# Patient Record
Sex: Female | Born: 1975 | Race: Black or African American | Hispanic: No | Marital: Married | Smoking: Never smoker
Health system: Southern US, Community
[De-identification: ages and names within clinical notes are randomized; demographics above are authoritative.]

## PROBLEM LIST (undated history)

## (undated) ENCOUNTER — Inpatient Hospital Stay (HOSPITAL_COMMUNITY): Payer: Self-pay

## (undated) DIAGNOSIS — D649 Anemia, unspecified: Secondary | ICD-10-CM

## (undated) DIAGNOSIS — O139 Gestational [pregnancy-induced] hypertension without significant proteinuria, unspecified trimester: Secondary | ICD-10-CM

## (undated) DIAGNOSIS — B999 Unspecified infectious disease: Secondary | ICD-10-CM

## (undated) HISTORY — DX: Unspecified infectious disease: B99.9

## (undated) HISTORY — DX: Gestational (pregnancy-induced) hypertension without significant proteinuria, unspecified trimester: O13.9

## (undated) HISTORY — DX: Anemia, unspecified: D64.9

---

## 1999-08-04 DIAGNOSIS — O139 Gestational [pregnancy-induced] hypertension without significant proteinuria, unspecified trimester: Secondary | ICD-10-CM

## 1999-08-04 HISTORY — DX: Gestational (pregnancy-induced) hypertension without significant proteinuria, unspecified trimester: O13.9

## 2006-08-03 HISTORY — PX: WISDOM TOOTH EXTRACTION: SHX21

## 2012-05-12 ENCOUNTER — Encounter: Payer: Self-pay | Admitting: Obstetrics and Gynecology

## 2012-05-12 ENCOUNTER — Ambulatory Visit (INDEPENDENT_AMBULATORY_CARE_PROVIDER_SITE_OTHER): Payer: 59 | Admitting: Obstetrics and Gynecology

## 2012-05-12 DIAGNOSIS — D649 Anemia, unspecified: Secondary | ICD-10-CM | POA: Insufficient documentation

## 2012-05-12 DIAGNOSIS — Z331 Pregnant state, incidental: Secondary | ICD-10-CM

## 2012-05-12 DIAGNOSIS — Z3201 Encounter for pregnancy test, result positive: Secondary | ICD-10-CM

## 2012-05-12 LAB — POCT URINALYSIS DIPSTICK
Blood, UA: NEGATIVE
Ketones, UA: NEGATIVE
Spec Grav, UA: <=1.005
Urobilinogen, UA: NEGATIVE
pH, UA: 8

## 2012-05-12 NOTE — Progress Notes (Signed)
NOB interview.  

## 2012-05-13 ENCOUNTER — Telehealth: Payer: Self-pay

## 2012-05-13 LAB — PRENATAL PANEL VII
Basophils Absolute: 0.1 10*3/uL (ref 0.0–0.1)
Basophils Relative: 1 % (ref 0–1)
HCT: 25.8 % — ABNORMAL LOW (ref 36.0–46.0)
HIV: NONREACTIVE
Hemoglobin: 7 g/dL — ABNORMAL LOW (ref 12.0–15.0)
Hepatitis B Surface Ag: NEGATIVE
Lymphocytes Relative: 32 % (ref 12–46)
MCHC: 27.1 g/dL — ABNORMAL LOW (ref 30.0–36.0)
Monocytes Absolute: 0.5 10*3/uL (ref 0.1–1.0)
Neutro Abs: 4.3 10*3/uL (ref 1.7–7.7)
Neutrophils Relative %: 59 % (ref 43–77)
RDW: 23.6 % — ABNORMAL HIGH (ref 11.5–15.5)
WBC: 7.1 10*3/uL (ref 4.0–10.5)

## 2012-05-13 NOTE — Telephone Encounter (Signed)
LM for pt that hgb is low and VL would like for her to add Fe twice daily to her PNV.  To call if questions.  ld

## 2012-05-13 NOTE — Telephone Encounter (Signed)
Message copied by Larwance Rote on Fri May 13, 2012  9:41 AM ------      Message from: Cornelius Moras      Created: Thu May 12, 2012  9:19 PM       Needs FeSO4 BID.

## 2012-05-15 ENCOUNTER — Encounter: Payer: Self-pay | Admitting: Obstetrics and Gynecology

## 2012-05-15 DIAGNOSIS — R8279 Other abnormal findings on microbiological examination of urine: Secondary | ICD-10-CM | POA: Insufficient documentation

## 2012-05-15 LAB — CULTURE, OB URINE: Colony Count: 10000

## 2012-05-16 ENCOUNTER — Encounter: Payer: Self-pay | Admitting: Obstetrics and Gynecology

## 2012-05-16 DIAGNOSIS — Z283 Underimmunization status: Secondary | ICD-10-CM | POA: Insufficient documentation

## 2012-05-16 DIAGNOSIS — O9989 Other specified diseases and conditions complicating pregnancy, childbirth and the puerperium: Secondary | ICD-10-CM

## 2012-05-16 LAB — HEMOGLOBINOPATHY EVALUATION
Hgb A2 Quant: 1.9 % — ABNORMAL LOW (ref 2.2–3.2)
Hgb A: 96.8 % (ref 96.8–97.8)
Hgb F Quant: 1.3 % (ref 0.0–2.0)
Hgb S Quant: 0 %

## 2012-06-01 ENCOUNTER — Ambulatory Visit (INDEPENDENT_AMBULATORY_CARE_PROVIDER_SITE_OTHER): Payer: 59 | Admitting: Obstetrics and Gynecology

## 2012-06-01 VITALS — BP 112/72 | Wt 156.0 lb

## 2012-06-01 DIAGNOSIS — R82998 Other abnormal findings in urine: Secondary | ICD-10-CM

## 2012-06-01 DIAGNOSIS — Z331 Pregnant state, incidental: Secondary | ICD-10-CM

## 2012-06-01 DIAGNOSIS — O26849 Uterine size-date discrepancy, unspecified trimester: Secondary | ICD-10-CM

## 2012-06-01 DIAGNOSIS — R8279 Other abnormal findings on microbiological examination of urine: Secondary | ICD-10-CM

## 2012-06-01 DIAGNOSIS — O09299 Supervision of pregnancy with other poor reproductive or obstetric history, unspecified trimester: Secondary | ICD-10-CM

## 2012-06-01 DIAGNOSIS — D649 Anemia, unspecified: Secondary | ICD-10-CM

## 2012-06-01 LAB — POCT URINALYSIS DIPSTICK
Nitrite, UA: NEGATIVE
Spec Grav, UA: 1.01
Urobilinogen, UA: NEGATIVE

## 2012-06-01 LAB — IRON AND TIBC: Iron: 30 ug/dL — ABNORMAL LOW (ref 42–145)

## 2012-06-01 NOTE — Progress Notes (Signed)
  Subjective:    Patricia Garrett is being seen today for her first obstetrical visit at [redacted]w[redacted]d gestation by LMP.  She reports doing well.  She and partner are vegetarian, but do eat fish.  Safe intake of fish reviewed.  Does take vitamins.  Had pre-eclampsia with last pregnancy, "but was eating very poorly and not taking care of myself".  This pregnancy is with new partner.  Delivered at [facility] [place].  Had elevated BP in 3rd trimester, then 24 hour urine showed pre-eclampsia, and had oligo.  Was induced, but doesn't remember being on magnesium.  No meds for BP required PP.  Her obstetrical history is significant for: Patient Active Problem List  Diagnosis  . Anemia  . Positive urine culture  . Rubella non-immune status, antepartum  . Hx of pre-eclampsia in prior pregnancy, currently pregnant  . Hx of oligohydramnios in prior pregnancy, currently pregnant  Urine culture + for 10,000 col staph--plan repeat culture.  Relationship with FOB:  Supportive, not married, new partner--Robert.  Feeding plan:   Breast  Pregnancy history fully reviewed.  The following portions of the patient's history were reviewed and updated as appropriate: allergies, current medications, past family history, past medical history, past social history, past surgical history and problem list.  Review of Systems Pertinent ROS is described in HPI   Objective:   BP 112/72  Wt 156 lb (70.761 kg)  LMP 03/21/2012 Wt Readings from Last 1 Encounters:  06/01/12 156 lb (70.761 kg)   BMI: There is no height on file to calculate BMI.  General: alert, cooperative and no distress Respiratory: clear to auscultation bilaterally Cardiovascular: regular rate and rhythm, S1, S2 normal, no murmur Breasts:  No dominant masses, nipples erect Gastrointestinal: soft, non-tender; no masses,  no organomegaly Extremities: extremities normal, no pain or edema Vaginal Bleeding: None  EXTERNAL GENITALIA: normal appearing vulva with  no masses, tenderness or lesions VAGINA: no abnormal discharge or lesions CERVIX: no lesions or cervical motion tenderness; cervix closed, long, firm UTERUS: gravid and consistent with 12-14 weeks, with patient 10 2/7 weeks by certain LMP. ADNEXA: no masses palpable and nontender OB EXAM PELVIMETRY: appears adequate   FHR:  160  bpm  Bedside US--apparent single gestation, +FM.   No attempt to date pregnancy or make any further evaluations with this machine. Offered [place] today, but patient unable to wait.  Assessment:    Pregnancy at  10 2/7 weeks by LMP, but S>D. Anemia Rubella equivocal Hx pre-eclampsia and oligohydramnios in previous pregnancy. Plan:     Prenatal panel reviewed and discussed with the patient:  yes Pap smear collected:  yes GC/Chlamydia collected:  yes Wet prep:  Negative Discussion of Genetic testing options: Declines Prenatal vitamins recommended Problem list reviewed and updated.  Plan of care: Next visit:  Within 1 week for [place] for dating due to S>D. Labs today in w/u for significant anemia--TIBC, peripheral blood smear, serum ferritin, serum iron.  Hgb electrophoresis already done and WNL. Recommend Floridix BID. Reviewed equivocal rubella and offer to re-vaccinate pp. Reviewed hx of pre-eclampsia and oligo in previous pregnancy--will observe closely.   Nigel Bridgeman, CNM, [place]

## 2012-06-01 NOTE — Progress Notes (Signed)
Pt is here today for her NOB work -up last pap over a year and half per pt.

## 2012-06-02 ENCOUNTER — Encounter: Payer: Self-pay | Admitting: Obstetrics and Gynecology

## 2012-06-02 LAB — PATHOLOGIST SMEAR REVIEW

## 2012-06-03 LAB — PAP IG, CT-NG, RFX HPV ASCU
Chlamydia Probe Amp: NEGATIVE
GC Probe Amp: NEGATIVE

## 2012-06-06 ENCOUNTER — Encounter: Payer: Self-pay | Admitting: Obstetrics and Gynecology

## 2012-06-08 ENCOUNTER — Other Ambulatory Visit: Payer: Self-pay | Admitting: Obstetrics and Gynecology

## 2012-06-08 ENCOUNTER — Ambulatory Visit (INDEPENDENT_AMBULATORY_CARE_PROVIDER_SITE_OTHER): Payer: 59 | Admitting: Obstetrics and Gynecology

## 2012-06-08 ENCOUNTER — Ambulatory Visit (INDEPENDENT_AMBULATORY_CARE_PROVIDER_SITE_OTHER): Payer: 59

## 2012-06-08 ENCOUNTER — Encounter: Payer: Self-pay | Admitting: Obstetrics and Gynecology

## 2012-06-08 VITALS — BP 120/70 | Ht 68.0 in | Wt 154.0 lb

## 2012-06-08 DIAGNOSIS — Z331 Pregnant state, incidental: Secondary | ICD-10-CM

## 2012-06-08 DIAGNOSIS — O26849 Uterine size-date discrepancy, unspecified trimester: Secondary | ICD-10-CM

## 2012-06-08 DIAGNOSIS — Z3490 Encounter for supervision of normal pregnancy, unspecified, unspecified trimester: Secondary | ICD-10-CM

## 2012-06-08 DIAGNOSIS — D649 Anemia, unspecified: Secondary | ICD-10-CM

## 2012-06-08 LAB — US OB COMP LESS 14 WKS

## 2012-06-08 NOTE — Progress Notes (Signed)
[redacted]w[redacted]d Ultrasound: Single gestation, normal fetal heart rate, ovaries normal, 12 weeks and 2 days. The patient reports that she is not certain about her last menstrual period.  Her due date will now be Dec 19, 2012 based on today's ultrasound. Anemia workup shows chronic anemia.  I recommend that the patient take iron 3 times daily.  Vitamin C recommended.  Fiber recommended for constipation. Genetic screening discussed.  Declines for now. Return to office in 4 weeks. Dr. Stefano Gaul

## 2012-06-08 NOTE — Progress Notes (Signed)
[redacted]w[redacted]d  U/S: S>D, Dating   12w 2 D IUP, Simple Right Ovarian cyst, Normal Left Ovary, No fluid in CDS, Normal [place].

## 2012-06-10 LAB — CULTURE, OB URINE

## 2012-07-06 ENCOUNTER — Ambulatory Visit (INDEPENDENT_AMBULATORY_CARE_PROVIDER_SITE_OTHER): Payer: 59 | Admitting: Obstetrics and Gynecology

## 2012-07-06 VITALS — BP 110/60 | Wt 156.0 lb

## 2012-07-06 DIAGNOSIS — R8279 Other abnormal findings on microbiological examination of urine: Secondary | ICD-10-CM

## 2012-07-06 DIAGNOSIS — R82998 Other abnormal findings in urine: Secondary | ICD-10-CM

## 2012-07-06 DIAGNOSIS — D649 Anemia, unspecified: Secondary | ICD-10-CM

## 2012-07-06 NOTE — Progress Notes (Signed)
Pt stated no issues today. Pt declined flu shot today.  

## 2012-07-29 ENCOUNTER — Other Ambulatory Visit: Payer: Self-pay

## 2012-07-29 DIAGNOSIS — Z3689 Encounter for other specified antenatal screening: Secondary | ICD-10-CM

## 2012-08-01 ENCOUNTER — Ambulatory Visit (INDEPENDENT_AMBULATORY_CARE_PROVIDER_SITE_OTHER): Payer: 59

## 2012-08-01 ENCOUNTER — Other Ambulatory Visit: Payer: 59

## 2012-08-01 ENCOUNTER — Ambulatory Visit (INDEPENDENT_AMBULATORY_CARE_PROVIDER_SITE_OTHER): Payer: 59 | Admitting: Obstetrics and Gynecology

## 2012-08-01 ENCOUNTER — Encounter: Payer: Self-pay | Admitting: Obstetrics and Gynecology

## 2012-08-01 VITALS — BP 110/70 | Wt 166.0 lb

## 2012-08-01 DIAGNOSIS — Z3689 Encounter for other specified antenatal screening: Secondary | ICD-10-CM

## 2012-08-01 DIAGNOSIS — Z331 Pregnant state, incidental: Secondary | ICD-10-CM

## 2012-08-01 DIAGNOSIS — Z3490 Encounter for supervision of normal pregnancy, unspecified, unspecified trimester: Secondary | ICD-10-CM

## 2012-08-01 LAB — US OB COMP + 14 WK

## 2012-08-01 NOTE — Progress Notes (Signed)
[redacted]w[redacted]d Ultrasound shows:  SIUP  S=D     [place] EDD: 12/09/12           AFI: normal fluid. AP pocket 6.7 cm           Cervical length: 3.85 cm           Placenta localization: posterior / Placenta edge to cx 6.5 cm           Fetal presentation: breech                    Anatomy survey is normal           Gender : female

## 2012-08-01 NOTE — Progress Notes (Signed)
Doing well.  Declines genetic screening. [place] reviewed, all WNL Will check Hgb at NV--7 at NOB. Reviewed rubella status and plan for offering vaccine pp. Recommend Floridix for Fe supplementation.

## 2012-08-03 NOTE — L&D Delivery Note (Signed)
Delivery Note  Pt was preparing to get epidural and had urge to push, cervix was complete and vtx +1, pt continued pushing well, FHR w decel to 80's, position changes and pt enc to continue pushing   At 12:28 AM a viable female was delivered via Vaginal, Spontaneous Delivery (Presentation: ; Occiput Posterior).  Delivered through loose nuchal cord, APGAR: 9, 9; weight: pending  Placenta status: Intact, Spontaneous.  Cord: 3 vessels with the following complications: None.  Cord pH: n/a   Anesthesia:  Local for repair  Episiotomy: None Lacerations: 1st degree;Perineal Suture Repair: 3.0 vicryl rapide Est. Blood Loss (mL): 300cc   Mom to postpartum.  Baby to nursery-stable. Infant remains skin-skin, mom and baby stable in recovery Pt plans to BF Declines circumcision  BP's remain stable, will CTO for now   Mileydi Milsap M 12/19/2012, 1:02 AM

## 2012-08-30 ENCOUNTER — Ambulatory Visit: Payer: 59 | Admitting: Obstetrics and Gynecology

## 2012-08-30 ENCOUNTER — Encounter: Payer: Self-pay | Admitting: Obstetrics and Gynecology

## 2012-08-30 VITALS — BP 110/76 | Wt 167.0 lb

## 2012-08-30 DIAGNOSIS — R8279 Other abnormal findings on microbiological examination of urine: Secondary | ICD-10-CM

## 2012-08-30 NOTE — Progress Notes (Signed)
[redacted]w[redacted]d No complaints per pt

## 2012-08-30 NOTE — Progress Notes (Signed)
Doing well--has increased nutritional intake of iron (blackstrap molasses, artichoke tea), prefers to Rx/OTC Fe preparations. Reviewed issue of anemia--will check CBC at [facility].  If still low, will refer to hematologist. Anticipating requirement of work travel to [place] in [place] precautions reviewed.  Will be around 30-32 weeks--would recommend no further significant travel after that.  Will see before trip to ensure status before traveling.

## 2012-09-27 ENCOUNTER — Ambulatory Visit: Payer: 59 | Admitting: Obstetrics and Gynecology

## 2012-09-27 ENCOUNTER — Other Ambulatory Visit: Payer: 59

## 2012-09-27 VITALS — BP 122/68 | Wt 171.0 lb

## 2012-09-27 DIAGNOSIS — D649 Anemia, unspecified: Secondary | ICD-10-CM

## 2012-09-27 DIAGNOSIS — Z331 Pregnant state, incidental: Secondary | ICD-10-CM

## 2012-09-27 DIAGNOSIS — Z3480 Encounter for supervision of other normal pregnancy, unspecified trimester: Secondary | ICD-10-CM | POA: Insufficient documentation

## 2012-09-27 DIAGNOSIS — Z3482 Encounter for supervision of other normal pregnancy, second trimester: Secondary | ICD-10-CM

## 2012-09-27 DIAGNOSIS — Z283 Underimmunization status: Secondary | ICD-10-CM

## 2012-09-27 DIAGNOSIS — R8279 Other abnormal findings on microbiological examination of urine: Secondary | ICD-10-CM

## 2012-09-27 LAB — CBC
Hemoglobin: 7.6 g/dL — ABNORMAL LOW (ref 12.0–15.0)
MCHC: 30.3 g/dL (ref 30.0–36.0)
Platelets: 255 10*3/uL (ref 150–400)
RDW: 21.3 % — ABNORMAL HIGH (ref 11.5–15.5)

## 2012-09-27 NOTE — Progress Notes (Signed)
[redacted]w[redacted]d Pt denies any complaints 1 hr glu and CBC

## 2012-09-28 LAB — GLUCOSE TOLERANCE, 1 HOUR (50G) W/O FASTING: Glucose, 1 Hour GTT: 89 mg/dL (ref 70–140)

## 2012-09-28 LAB — RPR

## 2012-09-30 ENCOUNTER — Other Ambulatory Visit: Payer: Self-pay

## 2012-09-30 DIAGNOSIS — D509 Iron deficiency anemia, unspecified: Secondary | ICD-10-CM

## 2012-10-03 ENCOUNTER — Telehealth: Payer: Self-pay | Admitting: Oncology

## 2012-10-03 NOTE — Telephone Encounter (Signed)
S/W pt in re NP appt 03/17 @ 1:30 w/Dr. Gaylyn Rong Referring Dr. Jamison Neighbor DX- IDA and preg Welcome packet mailed.

## 2012-10-04 ENCOUNTER — Telehealth: Payer: Self-pay | Admitting: Oncology

## 2012-10-04 NOTE — Telephone Encounter (Signed)
C/D 10/04/12 for appt. 10/17/12

## 2012-10-11 ENCOUNTER — Ambulatory Visit: Payer: 59 | Admitting: Obstetrics and Gynecology

## 2012-10-11 ENCOUNTER — Encounter: Payer: Self-pay | Admitting: Obstetrics and Gynecology

## 2012-10-11 VITALS — BP 112/70 | Wt 175.0 lb

## 2012-10-11 DIAGNOSIS — Z331 Pregnant state, incidental: Secondary | ICD-10-CM

## 2012-10-11 NOTE — Patient Instructions (Signed)
Fetal Movement Counts Patient Name: __________________________________________________ Patient Due Date: ____________________ Kick counts is highly recommended in high risk pregnancies, but it is a good idea for every pregnant woman to do. Start counting fetal movements at 28 weeks of the pregnancy. Fetal movements increase after eating a full meal or eating or drinking something sweet (the blood sugar is higher). It is also important to drink plenty of fluids (well hydrated) before doing the count. Lie on your left side because it helps with the circulation or you can sit in a comfortable chair with your arms over your belly (abdomen) with no distractions around you. DOING THE COUNT  Try to do the count the same time of day each time you do it.  Mark the day and time, then see how long it takes for you to feel 10 movements (kicks, flutters, swishes, rolls). You should have at least 10 movements within 2 hours. You will most likely feel 10 movements in much less than 2 hours. If you do not, wait an hour and count again. After a couple of days you will see a pattern.  What you are looking for is a change in the pattern or not enough counts in 2 hours. Is it taking longer in time to reach 10 movements? SEEK MEDICAL CARE IF:  You feel less than 10 counts in 2 hours. Tried twice.  No movement in one hour.  The pattern is changing or taking longer each day to reach 10 counts in 2 hours.  You feel the baby is not moving as it usually does. Date: ____________ Movements: ____________ Start time: ____________ Finish time: ____________  Date: ____________ Movements: ____________ Start time: ____________ Finish time: ____________ Date: ____________ Movements: ____________ Start time: ____________ Finish time: ____________ Date: ____________ Movements: ____________ Start time: ____________ Finish time: ____________ Date: ____________ Movements: ____________ Start time: ____________ Finish time:  ____________ Date: ____________ Movements: ____________ Start time: ____________ Finish time: ____________ Date: ____________ Movements: ____________ Start time: ____________ Finish time: ____________ Date: ____________ Movements: ____________ Start time: ____________ Finish time: ____________  Date: ____________ Movements: ____________ Start time: ____________ Finish time: ____________ Date: ____________ Movements: ____________ Start time: ____________ Finish time: ____________ Date: ____________ Movements: ____________ Start time: ____________ Finish time: ____________ Date: ____________ Movements: ____________ Start time: ____________ Finish time: ____________ Date: ____________ Movements: ____________ Start time: ____________ Finish time: ____________ Date: ____________ Movements: ____________ Start time: ____________ Finish time: ____________ Date: ____________ Movements: ____________ Start time: ____________ Finish time: ____________  Date: ____________ Movements: ____________ Start time: ____________ Finish time: ____________ Date: ____________ Movements: ____________ Start time: ____________ Finish time: ____________ Date: ____________ Movements: ____________ Start time: ____________ Finish time: ____________ Date: ____________ Movements: ____________ Start time: ____________ Finish time: ____________ Date: ____________ Movements: ____________ Start time: ____________ Finish time: ____________ Date: ____________ Movements: ____________ Start time: ____________ Finish time: ____________ Date: ____________ Movements: ____________ Start time: ____________ Finish time: ____________  Date: ____________ Movements: ____________ Start time: ____________ Finish time: ____________ Date: ____________ Movements: ____________ Start time: ____________ Finish time: ____________ Date: ____________ Movements: ____________ Start time: ____________ Finish time: ____________ Date: ____________ Movements:  ____________ Start time: ____________ Finish time: ____________ Date: ____________ Movements: ____________ Start time: ____________ Finish time: ____________ Date: ____________ Movements: ____________ Start time: ____________ Finish time: ____________ Date: ____________ Movements: ____________ Start time: ____________ Finish time: ____________  Date: ____________ Movements: ____________ Start time: ____________ Finish time: ____________ Date: ____________ Movements: ____________ Start time: ____________ Finish time: ____________ Date: ____________ Movements: ____________ Start time: ____________ Finish time: ____________ Date: ____________ Movements:   ____________ Start time: ____________ Finish time: ____________ Date: ____________ Movements: ____________ Start time: ____________ Finish time: ____________ Date: ____________ Movements: ____________ Start time: ____________ Finish time: ____________ Date: ____________ Movements: ____________ Start time: ____________ Finish time: ____________  Date: ____________ Movements: ____________ Start time: ____________ Finish time: ____________ Date: ____________ Movements: ____________ Start time: ____________ Finish time: ____________ Date: ____________ Movements: ____________ Start time: ____________ Finish time: ____________ Date: ____________ Movements: ____________ Start time: ____________ Finish time: ____________ Date: ____________ Movements: ____________ Start time: ____________ Finish time: ____________ Date: ____________ Movements: ____________ Start time: ____________ Finish time: ____________ Date: ____________ Movements: ____________ Start time: ____________ Finish time: ____________  Date: ____________ Movements: ____________ Start time: ____________ Finish time: ____________ Date: ____________ Movements: ____________ Start time: ____________ Finish time: ____________ Date: ____________ Movements: ____________ Start time: ____________ Finish  time: ____________ Date: ____________ Movements: ____________ Start time: ____________ Finish time: ____________ Date: ____________ Movements: ____________ Start time: ____________ Finish time: ____________ Date: ____________ Movements: ____________ Start time: ____________ Finish time: ____________ Date: ____________ Movements: ____________ Start time: ____________ Finish time: ____________  Date: ____________ Movements: ____________ Start time: ____________ Finish time: ____________ Date: ____________ Movements: ____________ Start time: ____________ Finish time: ____________ Date: ____________ Movements: ____________ Start time: ____________ Finish time: ____________ Date: ____________ Movements: ____________ Start time: ____________ Finish time: ____________ Date: ____________ Movements: ____________ Start time: ____________ Finish time: ____________ Date: ____________ Movements: ____________ Start time: ____________ Finish time: ____________ Document Released: 08/19/2006 Document Revised: 10/12/2011 Document Reviewed: 02/19/2009 [employer] Patient Information 2013 [employer].  

## 2012-10-11 NOTE — Progress Notes (Signed)
Pt decline flu shot at this time

## 2012-10-11 NOTE — Progress Notes (Signed)
[redacted]w[redacted]d Pt declined hematology eval bc she has not been taking iron.  Will  Take floridex.  Recheck at 36 weeks.  Pt understnads she may need blood transfusion after SVD Results for orders placed in visit on 09/27/12  RPR      Result Value Range   RPR NON REAC  NON REAC  GLUCOSE TOLERANCE, 1 HOUR (50G) W/O FASTING      Result Value Range   Glucose, 1 Hour GTT 89  70 - 140 mg/dL  CBC      Result Value Range   WBC 7.9  4.0 - 10.5 K/uL   RBC 4.05  3.87 - 5.11 MIL/uL   Hemoglobin 7.6 (*) 12.0 - 15.0 g/dL   HCT 45.4 (*) 09.8 - 11.9 %   MCV 62.0 (*) 78.0 - 100.0 fL   MCH 18.8 (*) 26.0 - 34.0 pg   MCHC 30.3  30.0 - 36.0 g/dL   RDW 14.7 (*) 82.9 - 56.2 %   Platelets 255  150 - 400 K/uL

## 2012-10-17 ENCOUNTER — Ambulatory Visit: Payer: Self-pay | Admitting: Oncology

## 2012-10-17 ENCOUNTER — Ambulatory Visit: Payer: Self-pay

## 2012-10-17 ENCOUNTER — Other Ambulatory Visit: Payer: Self-pay | Admitting: Lab

## 2012-10-20 NOTE — Progress Notes (Signed)
No show.  Reschedule prn.  

## 2012-11-25 LAB — OB RESULTS CONSOLE GC/CHLAMYDIA
Chlamydia: NEGATIVE
Gonorrhea: NEGATIVE

## 2012-11-25 LAB — OB RESULTS CONSOLE GBS: GBS: POSITIVE

## 2012-12-18 ENCOUNTER — Encounter (HOSPITAL_COMMUNITY): Payer: Self-pay | Admitting: *Deleted

## 2012-12-18 ENCOUNTER — Inpatient Hospital Stay (HOSPITAL_COMMUNITY)
Admission: AD | Admit: 2012-12-18 | Discharge: 2012-12-20 | DRG: 775 | Disposition: A | Discharge status: Home / Self Care | Payer: 59 | Source: Ambulatory Visit | Attending: Obstetrics and Gynecology | Admitting: Obstetrics and Gynecology

## 2012-12-18 DIAGNOSIS — O99820 Streptococcus B carrier state complicating pregnancy: Secondary | ICD-10-CM

## 2012-12-18 DIAGNOSIS — O99892 Other specified diseases and conditions complicating childbirth: Secondary | ICD-10-CM | POA: Diagnosis present

## 2012-12-18 DIAGNOSIS — O4290 Premature rupture of membranes, unspecified as to length of time between rupture and onset of labor, unspecified weeks of gestation: Principal | ICD-10-CM | POA: Diagnosis present

## 2012-12-18 DIAGNOSIS — Z283 Underimmunization status: Secondary | ICD-10-CM

## 2012-12-18 DIAGNOSIS — R8279 Other abnormal findings on microbiological examination of urine: Secondary | ICD-10-CM

## 2012-12-18 DIAGNOSIS — O09899 Supervision of other high risk pregnancies, unspecified trimester: Secondary | ICD-10-CM

## 2012-12-18 DIAGNOSIS — O9902 Anemia complicating childbirth: Secondary | ICD-10-CM | POA: Diagnosis present

## 2012-12-18 DIAGNOSIS — D649 Anemia, unspecified: Secondary | ICD-10-CM

## 2012-12-18 DIAGNOSIS — Z22330 Carrier of Group B streptococcus: Secondary | ICD-10-CM

## 2012-12-18 LAB — TYPE AND SCREEN
ABO/RH(D): O POS
Antibody Screen: NEGATIVE

## 2012-12-18 LAB — CBC
HCT: 33.8 % — ABNORMAL LOW (ref 36.0–46.0)
Hemoglobin: 10.2 g/dL — ABNORMAL LOW (ref 12.0–15.0)
MCH: 21.8 pg — ABNORMAL LOW (ref 26.0–34.0)
MCHC: 29.9 g/dL — ABNORMAL LOW (ref 30.0–36.0)
MCV: 72.5 fL — ABNORMAL LOW (ref 78.0–100.0)
MCV: 72.8 fL — ABNORMAL LOW (ref 78.0–100.0)
Platelets: 225 10*3/uL (ref 150–400)
RBC: 4.66 MIL/uL (ref 3.87–5.11)
RDW: 24.1 % — ABNORMAL HIGH (ref 11.5–15.5)
RDW: 24.1 % — ABNORMAL HIGH (ref 11.5–15.5)
WBC: 8.2 10*3/uL (ref 4.0–10.5)

## 2012-12-18 LAB — COMPREHENSIVE METABOLIC PANEL
ALT: 22 U/L (ref 0–35)
Alkaline Phosphatase: 139 U/L — ABNORMAL HIGH (ref 39–117)
BUN: 7 mg/dL (ref 6–23)
CO2: 23 mEq/L (ref 19–32)
GFR calc Af Amer: >90 mL/min (ref >90)
GFR calc non Af Amer: >90 mL/min (ref >90)
Glucose, Bld: 78 mg/dL (ref 70–99)
Potassium: 4.1 mEq/L (ref 3.5–5.1)
Sodium: 132 mEq/L — ABNORMAL LOW (ref 135–145)

## 2012-12-18 LAB — LACTATE DEHYDROGENASE: LDH: 191 U/L (ref 94–250)

## 2012-12-18 MED ORDER — OXYTOCIN 40 UNITS IN LACTATED RINGERS INFUSION - SIMPLE MED
1.0000 m[IU]/min | INTRAVENOUS | Status: DC
  Administered 2012-12-18: 1 m[IU]/min via INTRAVENOUS
  Administered 2012-12-18: 4 m[IU]/min via INTRAVENOUS
  Filled 2012-12-18: qty 1000

## 2012-12-18 MED ORDER — LACTATED RINGERS IV SOLN: 500.0000 mL | INTRAVENOUS | Status: DC | PRN

## 2012-12-18 MED ORDER — IBUPROFEN 600 MG PO TABS: 600.0000 mg | ORAL_TABLET | Freq: Four times a day (QID) | ORAL | Status: DC | PRN

## 2012-12-18 MED ORDER — LIDOCAINE HCL (PF) 1 % IJ SOLN
30.0000 mL | INTRAMUSCULAR | Status: DC | PRN
  Administered 2012-12-19: 30 mL via SUBCUTANEOUS
  Filled 2012-12-18 (×2): qty 30

## 2012-12-18 MED ORDER — LACTATED RINGERS IV SOLN
INTRAVENOUS | Status: DC
  Administered 2012-12-18 (×2): via INTRAVENOUS

## 2012-12-18 MED ORDER — TERBUTALINE SULFATE 1 MG/ML IJ SOLN: 0.2500 mg | Freq: Once | INTRAMUSCULAR | Status: AC | PRN

## 2012-12-18 MED ORDER — ACETAMINOPHEN 325 MG PO TABS: 650.0000 mg | ORAL_TABLET | ORAL | Status: DC | PRN

## 2012-12-18 MED ORDER — PENICILLIN G POTASSIUM 5000000 UNITS IJ SOLR
5.0000 10*6.[IU] | Freq: Once | INTRAVENOUS | Status: AC
  Administered 2012-12-18: 5 10*6.[IU] via INTRAVENOUS
  Filled 2012-12-18: qty 5

## 2012-12-18 MED ORDER — ONDANSETRON HCL 4 MG/2ML IJ SOLN: 4.0000 mg | Freq: Four times a day (QID) | INTRAMUSCULAR | Status: DC | PRN

## 2012-12-18 MED ORDER — OXYTOCIN 40 UNITS IN LACTATED RINGERS INFUSION - SIMPLE MED
62.5000 mL/h | INTRAVENOUS | Status: DC
  Administered 2012-12-19: 62.5 mL/h via INTRAVENOUS

## 2012-12-18 MED ORDER — OXYCODONE-ACETAMINOPHEN 5-325 MG PO TABS: 1.0000 | ORAL_TABLET | ORAL | Status: DC | PRN

## 2012-12-18 MED ORDER — FENTANYL CITRATE 0.05 MG/ML IJ SOLN: 100.0000 ug | INTRAMUSCULAR | Status: DC | PRN

## 2012-12-18 MED ORDER — OXYTOCIN BOLUS FROM INFUSION: 500.0000 mL | INTRAVENOUS | Status: DC

## 2012-12-18 MED ORDER — PENICILLIN G POTASSIUM 5000000 UNITS IJ SOLR
2.5000 10*6.[IU] | INTRAVENOUS | Status: DC
  Administered 2012-12-18 (×3): 2.5 10*6.[IU] via INTRAVENOUS
  Filled 2012-12-18 (×9): qty 2.5

## 2012-12-18 MED ORDER — CITRIC ACID-SODIUM CITRATE 334-500 MG/5ML PO SOLN: 30.0000 mL | ORAL | Status: DC | PRN

## 2012-12-18 NOTE — Progress Notes (Signed)
Patient ID: Patricia Garrett, female   DOB: 1976-03-09, 37 y.o.   MRN: [id number] [name] is a 37 y.o. G2P0101 at [redacted]w[redacted]d admitted for PROM  Subjective: Requesting pain meds  Objective: BP 135/100  Pulse 67  Temp(Src) 97.9 F (36.6 C) (Oral)  Resp 20  Ht 5\' 8"  (1.727 m)  Wt 186 lb (84.369 kg)  BMI 28.29 kg/m2  SpO2 100%  LMP 03/21/2012     FHT:  FHR: 120 bpm, variability: moderate,  accelerations:  Present,  decelerations:  Present occ variable UC:   regular, every 2 minutes SVE:   6/100/-1   Assessment / Plan: PROM x22 hours  Labor: active labor Preeclampsia:  BP recently elevated, no other s/s, labs done today WNL, will check cath UA and CTO BP closely Fetal Wellbeing:  Category I Pain Control:  will place epidural, fentanyl IVP if there is a delay for anesthesia Anticipated MOD:  NSVD  GBS pos, has rcv'd PCN, most recent dose at 11pm (did not receive 5pm and 9pm dose) Recheck after epidural   Update physician PRN   Malissa Hippo 12/18/2012, 11:58 PM

## 2012-12-18 NOTE — MAU Note (Signed)
Leaking clear fld since 0200. Feels like baby balls up at times but no pain

## 2012-12-18 NOTE — H&P (Signed)
Patricia Garrett is a 37 y.o. female presenting for SROM at about 2am, leaking scant clear fluid now, denies any VB, +FM. Irregular non-painful ctx, cervix was closed at last OV.  HPI: Pt began PNC at [facility] at 10wks Hgb was 7.0, anemia w/u revealed chronic anemia, pt declined hematology consult, and was instructed to FE supplement, f/u hgb at 28wks 7.6 and repeat at 34wks =9.1 Anatomy US at 20wks WNL 1hr gtt =89,  Otherwise routine pregnancy  Maternal Medical History:  Reason for admission: Rupture of membranes.   Contractions: Onset was 3-5 hours ago.   Frequency: irregular.   Perceived severity is mild.    Fetal activity: Perceived fetal activity is normal.   Last perceived fetal movement was within the past hour.    Prenatal complications: no prenatal complications   OB History   Grav Para Term Preterm Abortions TAB SAB Ect Mult Living   2 1  1      1      Past Medical History  Diagnosis Date  . Pregnancy induced hypertension 2001  . Preterm labor 2001  . Infection     HX FREQUESNT YEAST  . Anemia     DURING PREGNANCY   Past Surgical History  Procedure Laterality Date  . Wisdom tooth extraction  2008   Family History: family history includes Heart disease in her mother and Stroke in her mother. Social History:  reports that she has never smoked. She has never used smokeless tobacco. She reports that she does not drink alcohol or use illicit drugs.   Prenatal Transfer Tool  Maternal Diabetes: No Genetic Screening: Declined Maternal Ultrasounds/Referrals: Normal Fetal Ultrasounds or other Referrals:  None Maternal Substance Abuse:  No Significant Maternal Medications:  None Significant Maternal Lab Results:  Lab values include: Group B Strep positive Other Comments:  None  Review of Systems  All other systems reviewed and are negative.    Dilation: 2 Effacement (%): 50 Station: -2 Exam by:: s. Norvin Ohlin cnm Blood pressure 136/88, pulse 66, temperature 98 F  (36.7 C), temperature source Oral, resp. rate 18, height 5\' 8"  (1.727 m), weight 186 lb (84.369 kg), last menstrual period 03/21/2012, SpO2 100.00%. Maternal Exam:  Uterine Assessment: Contraction strength is mild.  Contraction frequency is irregular.   Abdomen: Patient reports no abdominal tenderness. Fundal height is aga.   Estimated fetal weight is 7-8#.   Fetal presentation: vertex  Introitus: Normal vulva. Normal vagina.  Ferning test: positive.  Amniotic fluid character: clear.  Pelvis: adequate for delivery.   Cervix: Cervix evaluated by digital exam.     Fetal Exam Fetal Monitor Review: Mode: ultrasound.   Baseline rate: 130.  Variability: moderate (6-25 bpm).   Pattern: accelerations present and no decelerations.    Fetal State Assessment: Category I - tracings are normal.     Physical Exam  Nursing note and vitals reviewed. Constitutional: She is oriented to person, place, and time. She appears well-developed and well-nourished.  HENT:  Head: Normocephalic.  Eyes: Pupils are equal, round, and reactive to light.  Neck: Normal range of motion.  Cardiovascular: Normal rate, regular rhythm and normal heart sounds.   Respiratory: Effort normal and breath sounds normal.  GI: Soft. Bowel sounds are normal.  Genitourinary: Vagina normal.  Musculoskeletal: Normal range of motion.  Neurological: She is alert and oriented to person, place, and time. She has normal reflexes.  Skin: Skin is warm and dry.  Psychiatric: She has a normal mood and affect. Her behavior is  normal.    Prenatal labs: ABO, Rh: O/POS/-- (10/10 1135) Antibody: NEG (10/10 1135) Rubella: 8.1 (10/10 1135) equivocal  RPR: NON REAC (02/25 1156)  HBsAg: NEGATIVE (10/10 1135)  HIV: NON REACTIVE (10/10 1135)  GBS: Positive (04/25 0000)  GC/CT neg at NOB and on 4/25 hgb electrophoresis - normal  Pap - WNL Anemia work-up - chronic anemia  hgb at NOB =7.0, f/u at 28wks =7.6, f/u at 34wks =9.1     Assessment/Plan: IUP at [redacted]w[redacted]d GBS pos FHR reassuring SROM without active labor at present Hx severe anemia  Admit to b.s per c/w Dr Su Hilt Routine L&D orders PCN per protocol for GBS prophylaxis  Expectant management at present, consider augmentation if no active labor in 4-6hours    [name] M 12/18/2012, 4:12 AM

## 2012-12-18 NOTE — Progress Notes (Signed)
Report called to [facility] in [facility]. Pt to BS from [facility] via w/c

## 2012-12-18 NOTE — Progress Notes (Addendum)
Patient ID: Patricia Garrett, female   DOB: 1976/03/13, 37 y.o.   MRN: [id number] Patricia Garrett is a 37 y.o. G2P0101 at [redacted]w[redacted]d admitted for SROM  Subjective: Coping well w ctx, denies need for pain meds  Objective: BP 120/81  Pulse 66  Temp(Src) 98 F (36.7 C) (Oral)  Resp 20  Ht 5\' 8"  (1.727 m)  Wt 186 lb (84.369 kg)  BMI 28.29 kg/m2  SpO2 100%  LMP 03/21/2012     FHT:  FHR: 120 bpm, variability: moderate,  accelerations:  Present,  decelerations:  Absent UC:   regular, every 2-3 minutes SVE:   Dilation: 1.5 Effacement (%): 50 Station: -3 Exam by:: Victorino Dike, CNM   Exam deferred at present   Assessment / Plan: prolonged SROM  GBS pos, on PCN   Labor: now on pitocin  Preeclampsia:  no s/s Fetal Wellbeing:  Category I Pain Control:  Labor support without medications Anticipated MOD:  NSVD  Recheck 2-3hrs or prn  Dr Su Hilt updated    Malissa Hippo 12/18/2012, 8:26 PM

## 2012-12-18 NOTE — Progress Notes (Signed)
Patient ID: Patricia Garrett, female   DOB: [birth year], 37 y.o.   MRN: [id number] Patricia Garrett is a 37 y.o. G2P0101 at [redacted]w[redacted]d admitted for SROM  Subjective: Breathing through ctx, coping well, family at [facility], declines pain meds   Objective: BP 157/100  Pulse 87  Temp(Src) 97.9 F (36.6 C) (Oral)  Resp 20  Ht 5\' 8"  (1.727 m)  Wt 186 lb (84.369 kg)  BMI 28.29 kg/m2  SpO2 100%  LMP 03/21/2012     FHT:  FHR: 120 bpm, variability: moderate,  accelerations:  Present,  decelerations:  Absent UC:   regular, every 2 minutes SVE:   Dilation: 1.5 Effacement (%): 50 Station: -3 Exam by:: Victorino Dike, CNM   Exam declined   Assessment / Plan:   Labor: PROM, pitocin augmentation continues at 11mu  Preeclampsia:  no s/s Fetal Wellbeing:  Category I Pain Control:  Labor support without medications Anticipated MOD:  NSVD  Pt had refused PCN dose at 5pm and 9pm, after discussion w pt and sig other, including r/b of PCN, pt now consents to receiving it, (had already received 3 doses today)  Recheck 1-2 hours  Update physician PRN   Malissa Hippo 12/18/2012, 11:23 PM

## 2012-12-18 NOTE — Progress Notes (Signed)
  Subjective: Pt reports occasional mild UCs.  BP x2 elevated.      Objective: BP 127/92  Pulse 64  Temp(Src) 98 F (36.7 C) (Oral)  Resp 18  Ht 5\' 8"  (1.727 m)  Wt 186 lb (84.369 kg)  BMI 28.29 kg/m2  SpO2 100%  LMP 03/21/2012   FHT:  Cat I UC:   Occasional mild  SVE:   Dilation: 2 Effacement (%): 50 Station: -3 Exam by:: Arkel Cartwright, cnm  Assessment / Plan: [facility] labs ordered Pitocin initiated per protocol C/w Dr. Estanislado Pandy  Labor: PROM without labor Preeclampsia: Elevated BP, pending [facility] labs Fetal Wellbeing: Cat I Pain Control: n/a I/D: GBS prophylaxis per protocol Anticipated MOD: SVD   Patricia Garrett 12/18/2012, 2:10 PM

## 2012-12-19 ENCOUNTER — Encounter (HOSPITAL_COMMUNITY): Payer: Self-pay | Admitting: *Deleted

## 2012-12-19 LAB — CBC
HCT: 30.8 % — ABNORMAL LOW (ref 36.0–46.0)
Hemoglobin: 9.4 g/dL — ABNORMAL LOW (ref 12.0–15.0)
MCH: 22.1 pg — ABNORMAL LOW (ref 26.0–34.0)
MCV: 72.3 fL — ABNORMAL LOW (ref 78.0–100.0)
RBC: 4.26 MIL/uL (ref 3.87–5.11)

## 2012-12-19 MED ORDER — EPHEDRINE 5 MG/ML INJ
10.0000 mg | INTRAVENOUS | Status: DC | PRN
  Filled 2012-12-19: qty 2

## 2012-12-19 MED ORDER — BISACODYL 10 MG RE SUPP: 10.0000 mg | Freq: Every day | RECTAL | Status: DC | PRN

## 2012-12-19 MED ORDER — PRENATAL MULTIVITAMIN CH
1.0000 | ORAL_TABLET | Freq: Every day | ORAL | Status: DC
  Administered 2012-12-19: 1 via ORAL
  Filled 2012-12-19 (×2): qty 1

## 2012-12-19 MED ORDER — IBUPROFEN 600 MG PO TABS
600.0000 mg | ORAL_TABLET | Freq: Four times a day (QID) | ORAL | Status: DC
  Administered 2012-12-19 (×3): 600 mg via ORAL
  Filled 2012-12-19 (×3): qty 1

## 2012-12-19 MED ORDER — ONDANSETRON HCL 4 MG/2ML IJ SOLN: 4.0000 mg | INTRAMUSCULAR | Status: DC | PRN

## 2012-12-19 MED ORDER — FLEET ENEMA 7-19 GM/118ML RE ENEM: 1.0000 | ENEMA | Freq: Every day | RECTAL | Status: DC | PRN

## 2012-12-19 MED ORDER — SENNOSIDES-DOCUSATE SODIUM 8.6-50 MG PO TABS: 2.0000 | ORAL_TABLET | Freq: Every day | ORAL | Status: DC

## 2012-12-19 MED ORDER — DIBUCAINE 1 % RE OINT: 1.0000 "application " | TOPICAL_OINTMENT | RECTAL | Status: DC | PRN

## 2012-12-19 MED ORDER — MEASLES, MUMPS & RUBELLA VAC ~~LOC~~ INJ
0.5000 mL | INJECTION | Freq: Once | SUBCUTANEOUS | Status: DC
  Filled 2012-12-19: qty 0.5

## 2012-12-19 MED ORDER — TETANUS-DIPHTH-ACELL PERTUSSIS 5-2.5-18.5 LF-MCG/0.5 IM SUSP: 0.5000 mL | Freq: Once | INTRAMUSCULAR | Status: DC

## 2012-12-19 MED ORDER — DIPHENHYDRAMINE HCL 50 MG/ML IJ SOLN: 12.5000 mg | INTRAMUSCULAR | Status: DC | PRN

## 2012-12-19 MED ORDER — DIPHENHYDRAMINE HCL 25 MG PO CAPS: 25.0000 mg | ORAL_CAPSULE | Freq: Four times a day (QID) | ORAL | Status: DC | PRN

## 2012-12-19 MED ORDER — FENTANYL 2.5 MCG/ML BUPIVACAINE 1/10 % EPIDURAL INFUSION (WH - ANES): 14.0000 mL/h | INTRAMUSCULAR | Status: DC | PRN

## 2012-12-19 MED ORDER — WITCH HAZEL-GLYCERIN EX PADS: 1.0000 "application " | MEDICATED_PAD | CUTANEOUS | Status: DC | PRN

## 2012-12-19 MED ORDER — PHENYLEPHRINE 40 MCG/ML (10ML) SYRINGE FOR IV PUSH (FOR BLOOD PRESSURE SUPPORT)
80.0000 ug | PREFILLED_SYRINGE | INTRAVENOUS | Status: DC | PRN
  Filled 2012-12-19: qty 2

## 2012-12-19 MED ORDER — LANOLIN HYDROUS EX OINT: TOPICAL_OINTMENT | CUTANEOUS | Status: DC | PRN

## 2012-12-19 MED ORDER — ONDANSETRON HCL 4 MG PO TABS: 4.0000 mg | ORAL_TABLET | ORAL | Status: DC | PRN

## 2012-12-19 MED ORDER — MEDROXYPROGESTERONE ACETATE 150 MG/ML IM SUSP: 150.0000 mg | INTRAMUSCULAR | Status: DC | PRN

## 2012-12-19 MED ORDER — OXYCODONE-ACETAMINOPHEN 5-325 MG PO TABS: 1.0000 | ORAL_TABLET | ORAL | Status: DC | PRN

## 2012-12-19 MED ORDER — LACTATED RINGERS IV SOLN: 500.0000 mL | Freq: Once | INTRAVENOUS | Status: DC

## 2012-12-19 MED ORDER — SIMETHICONE 80 MG PO CHEW: 80.0000 mg | CHEWABLE_TABLET | ORAL | Status: DC | PRN

## 2012-12-19 MED ORDER — BENZOCAINE-MENTHOL 20-0.5 % EX AERO: 1.0000 "application " | INHALATION_SPRAY | CUTANEOUS | Status: DC | PRN

## 2012-12-19 MED ORDER — ZOLPIDEM TARTRATE 5 MG PO TABS: 5.0000 mg | ORAL_TABLET | Freq: Every evening | ORAL | Status: DC | PRN

## 2012-12-19 MED ORDER — POLYSACCHARIDE IRON COMPLEX 150 MG PO CAPS
150.0000 mg | ORAL_CAPSULE | Freq: Two times a day (BID) | ORAL | Status: DC
  Administered 2012-12-19: 150 mg via ORAL
  Filled 2012-12-19 (×5): qty 1

## 2012-12-20 NOTE — Discharge Summary (Signed)
  Vaginal Delivery Discharge Summary  Mar [facility]ettler [facility]  DOB:    February 03, 1976 MRN:    [id number] CSN:    [id number]  Date of admission:                  12/18/12  Date of discharge:                   12/20/12  Procedures this admission:  Date of Delivery: 12/19/12  Newborn Data:  Live born female  Birth Weight: 6 lb 8 oz (2948 g) APGAR: 9, 9  Home with mother. Circumcision Plan: No circ  History of Present Illness:  Ms. Patricia Garrett is a 37 y.o. female, G2P1102, who presents at [redacted]w[redacted]d weeks gestation. The patient has been followed at the [facility] and Gynecology division of [facility]. She was admitted rupture of membranes. Her pregnancy has been complicated by:  Patient Active Problem List   Diagnosis Date Noted  . Vaginal delivery 12/19/2012  . Delayed delivery after SROM (spontaneous rupture of membranes) 12/18/2012  . PROM (premature rupture of membranes) 12/18/2012  . GBS (group B Streptococcus carrier), +RV culture, currently pregnant 12/18/2012  . Normal pregnancy, repeat 09/27/2012  . Hx of pre-eclampsia in prior pregnancy, currently pregnant 06/01/2012  . Hx of oligohydramnios in prior pregnancy, currently pregnant 06/01/2012  . Rubella non-immune status, antepartum 05/16/2012  . Positive urine culture 05/15/2012  . Anemia 05/12/2012     Hospital course:  The patient was admitted for SROM and minimal labor.   GBS prophylaxis was implemented, and she received 4 doses of PCN prior to delivery.  Her labor was augmented with pitocin, and she did not use any pain medication--epidural was about to be placed when she had the urge to push.  Her labor was not complicated. She proceeded to have a vaginal delivery of a healthy infant. Her delivery was not complicated. Her postpartum course was not complicated.  She had a 1st degree laceration.  She was discharged to home on postpartum day 1 doing  well.  Feeding:  breast  Contraception:  no method--declines at present.  Discharge hemoglobin:  Hemoglobin  Date Value Range Status  12/19/2012 9.4* 12.0 - 15.0 g/dL Final     HCT  Date Value Range Status  12/19/2012 30.8* 36.0 - 46.0 % Final    Discharge Physical Exam:   General: alert Lochia: appropriate Uterine Fundus: firm Incision: healing well DVT Evaluation: No evidence of DVT seen on physical exam. Negative Homan's sign.  Intrapartum Procedures: spontaneous vaginal delivery Postpartum Procedures: none Complications-Operative and Postpartum: 1st degree perineal laceration  Discharge Diagnoses: Term Pregnancy-delivered  Discharge Information:  Activity:           Per [facility] handout Diet:                routine Medications: Declines Rx meds--may use OTC Tylenol/Ibuprophen prn Condition:      stable Instructions:  refer to practice specific booklet Discharge to: home  Follow-up Information   Follow up with [facility]. Schedule an appointment as soon as possible for a visit in 6 weeks. (Call for any questions or concerns.)    Contact information:   [address] [place] [address] [phone]       Nigel Bridgeman 12/20/2012

## 2014-06-04 ENCOUNTER — Encounter (HOSPITAL_COMMUNITY): Payer: Self-pay | Admitting: *Deleted

## 2016-02-16 ENCOUNTER — Emergency Department (HOSPITAL_COMMUNITY)
Admission: EM | Admit: 2016-02-16 | Discharge: 2016-02-16 | Disposition: A | Discharge status: Home / Self Care | Payer: Managed Care, Other (non HMO) | Attending: Emergency Medicine | Admitting: Emergency Medicine

## 2016-02-16 ENCOUNTER — Encounter (HOSPITAL_COMMUNITY): Payer: Self-pay

## 2016-02-16 DIAGNOSIS — Z79899 Other long term (current) drug therapy: Secondary | ICD-10-CM | POA: Diagnosis not present

## 2016-02-16 DIAGNOSIS — R1084 Generalized abdominal pain: Secondary | ICD-10-CM | POA: Insufficient documentation

## 2016-02-16 LAB — COMPREHENSIVE METABOLIC PANEL
ALT: 14 U/L (ref 14–54)
AST: 23 U/L (ref 15–41)
Albumin: 4.2 g/dL (ref 3.5–5.0)
Alkaline Phosphatase: 42 U/L (ref 38–126)
Anion gap: 7 (ref 5–15)
BUN: 12 mg/dL (ref 6–20)
CALCIUM: 9.4 mg/dL (ref 8.9–10.3)
CHLORIDE: 105 mmol/L (ref 101–111)
CO2: 25 mmol/L (ref 22–32)
Creatinine, Ser: 0.79 mg/dL (ref 0.44–1.00)
GFR calc Af Amer: >60 mL/min (ref >60)
Glucose, Bld: 87 mg/dL (ref 65–99)
POTASSIUM: 3.7 mmol/L (ref 3.5–5.1)
Sodium: 137 mmol/L (ref 135–145)
TOTAL PROTEIN: 7.4 g/dL (ref 6.5–8.1)
Total Bilirubin: 0.8 mg/dL (ref 0.3–1.2)

## 2016-02-16 LAB — CBC
HCT: 26.8 % — ABNORMAL LOW (ref 36.0–46.0)
Hemoglobin: 8 g/dL — ABNORMAL LOW (ref 12.0–15.0)
MCH: 20 pg — ABNORMAL LOW (ref 26.0–34.0)
MCHC: 29.9 g/dL — ABNORMAL LOW (ref 30.0–36.0)
MCV: 67 fL — AB (ref 78.0–100.0)
PLATELETS: 352 10*3/uL (ref 150–400)
RBC: 4 MIL/uL (ref 3.87–5.11)
RDW: 17.1 % — AB (ref 11.5–15.5)
WBC: 4.6 10*3/uL (ref 4.0–10.5)

## 2016-02-16 LAB — URINALYSIS, ROUTINE W REFLEX MICROSCOPIC
BILIRUBIN URINE: NEGATIVE
GLUCOSE, UA: NEGATIVE mg/dL
HGB URINE DIPSTICK: NEGATIVE
KETONES UR: NEGATIVE mg/dL
Nitrite: NEGATIVE
PROTEIN: NEGATIVE mg/dL
Specific Gravity, Urine: 1.017 (ref 1.005–1.030)
pH: 7 (ref 5.0–8.0)

## 2016-02-16 LAB — POC URINE PREG, ED: Preg Test, Ur: NEGATIVE

## 2016-02-16 LAB — URINE MICROSCOPIC-ADD ON: RBC / HPF: NONE SEEN RBC/hpf (ref 0–5)

## 2016-02-16 LAB — LIPASE, BLOOD: Lipase: 33 U/L (ref 11–51)

## 2016-02-16 NOTE — ED Notes (Signed)
Pt tolerated PO fluids well  

## 2016-02-16 NOTE — ED Provider Notes (Signed)
CSN: [id number]     Arrival date & time 02/16/16  1245 History   First MD Initiated Contact with Patient 02/16/16 1254     Chief Complaint  Patient presents with  . Abdominal Pain   (Consider location/radiation/quality/duration/timing/severity/associated sxs/prior Treatment) HPI 40 y.o. female presents to the Emergency Department today complaining of RUQ/RLQ abdominal pain with onset last night. Pt states that that pain felt like a sting/burn sensation that migrated from RUQ to RLQ. No hx of the same. States pain was intense at onset. Noted occuring while urinating after intercourse. Pain lasted a few minutes and then subsided. Has residual pain at the moment with pain score 2/10. No N/V/D. No fevers. No chills. No CP/SOB. No vaginal discharge/bleeding. No dysuria. No other symptoms noted   Past Medical History  Diagnosis Date  . Pregnancy induced hypertension 2001  . Preterm labor 2001  . Infection     HX FREQUESNT YEAST  . Anemia     DURING PREGNANCY  . Vaginal delivery 12/19/2012   Past Surgical History  Procedure Laterality Date  . Wisdom tooth extraction  2008   Family History  Problem Relation Age of Onset  . Heart disease Mother   . Stroke Mother    Social History  Substance Use Topics  . Smoking status: Never Smoker   . Smokeless tobacco: Never Used  . Alcohol Use: No   OB History    Gravida Para Term Preterm AB TAB SAB Ectopic Multiple Living   2 2 1 1      2      Review of Systems ROS reviewed and all are negative for acute change except as noted in the HPI.  Allergies  Review of patient's allergies indicates no known allergies.  Home Medications   Prior to Admission medications   Medication Sig Start Date End Date Taking? Authorizing Provider  IRON PO Take 10 mLs by mouth 2 (two) times daily.    Historical Provider, MD  Prenatal Vit-Fe Fumarate-FA (PRENATAL MULTIVITAMIN) TABS Take 2 tablets by mouth 2 (two) times daily.    Historical Provider, MD   BP  121/98 mmHg  Pulse 78  Temp(Src) 98.8 F (37.1 C) (Oral)  Resp 17  SpO2 100%  LMP 02/01/2016   Physical Exam  Constitutional: She is oriented to person, place, and time. She appears well-developed and well-nourished.  HENT:  Head: Normocephalic and atraumatic.  Eyes: EOM are normal. Pupils are equal, round, and reactive to light.  Neck: Normal range of motion. Neck supple. No tracheal deviation present.  Cardiovascular: Normal rate, regular rhythm, normal heart sounds and intact distal pulses.   No murmur heard. Pulmonary/Chest: Effort normal and breath sounds normal. No respiratory distress. She has no wheezes. She has no rales. She exhibits no tenderness.  Abdominal: Soft. Normal appearance and bowel sounds are normal. There is no tenderness. There is no rigidity, no rebound, no guarding, no tenderness at McBurney's point and negative Murphy's sign.  Musculoskeletal: Normal range of motion.  Neurological: She is alert and oriented to person, place, and time.  Skin: Skin is warm and dry.  Psychiatric: She has a normal mood and affect. Her behavior is normal. Thought content normal.  Nursing note and vitals reviewed.  ED Course  Procedures (including critical care time) Labs Review Labs Reviewed  CBC - Abnormal; Notable for the following:    Hemoglobin 8.0 (*)    HCT 26.8 (*)    MCV 67.0 (*)    MCH 20.0 (*)  MCHC 29.9 (*)    RDW 17.1 (*)    All other components within normal limits  URINALYSIS, ROUTINE W REFLEX MICROSCOPIC (NOT AT [facility]) - Abnormal; Notable for the following:    APPearance CLOUDY (*)    Leukocytes, UA SMALL (*)    All other components within normal limits  URINE MICROSCOPIC-ADD ON - Abnormal; Notable for the following:    Squamous Epithelial / LPF 6-30 (*)    Bacteria, UA RARE (*)    All other components within normal limits  COMPREHENSIVE METABOLIC PANEL  LIPASE, BLOOD  POC URINE PREG, ED   Imaging Review No results found. I have personally  reviewed and evaluated these images and lab results as part of my medical decision-making.   EKG Interpretation None      MDM  I have reviewed and evaluated the relevant laboratory values.  I have reviewed the relevant previous healthcare records. I obtained HPI from historian. Patient discussed with supervising physician  ED Course:  Assessment: Patient is a 39yF presents with abdominal pain since last night. RUQ/RLQ. Symptoms have improved since last night with residual discomfort in RUQ. No hx same. Occurred after sexual intercourse. No pelvic symptoms. On exam, nontoxic, nonseptic appearing, in no apparent distress. Patient's pain and other symptoms adequately managed in emergency department.  Fluid bolus given.  CBC/BMP unremarkable. UA unremarkable. Preg negative. Vitals reviewed. Patient does not meet the SIRS or Sepsis criteria.  On repeat exam patient does not have a surgical abdomen and there are no peritoneal signs.  No indication of appendicitis, bowel obstruction, bowel perforation, cholecystitis, diverticulitis, PID or ectopic pregnancy. Likely ruptured ovarian cyst that caused symptoms. Discussed wit hsupervising physician. Low likelihood of cholecystitis as symptoms are not exacerbated by PO intake. Patient discharged home with symptomatic treatment and given strict instructions for follow-up with their primary care physician.  I have also discussed reasons to return immediately to the ER.  Patient expresses understanding and agrees with plan.  Disposition/Plan:  DC Home Additional Verbal discharge instructions given and discussed with patient.  Pt Instructed to f/u with PCP in the next week for evaluation and treatment of symptoms. Return precautions given Pt acknowledges and agrees with plan  Supervising Physician Rolland Porter, MD  Final diagnoses:  Generalized abdominal pain     Audry Pili, PA-C 02/16/16 1515  Rolland Porter, MD 02/24/16 [phone]

## 2016-02-16 NOTE — ED Notes (Signed)
Pt c/o severe R side abdominal pain after intercourse last night.  Sts pain has almost completely resolved.  Pain score 2/10.  Pt reports dizziness and nausea w/ pain, which have resolved.  Denies GU complaints.  Sts "it almost felt like a pulled muscle."

## 2016-02-16 NOTE — Discharge Instructions (Signed)
Please read and follow all provided instructions.  Your diagnoses today include:  1. Generalized abdominal pain    Tests performed today include:  Blood counts and electrolytes  Blood tests to check liver and kidney function  Blood tests to check pancreas function  Urine test to look for infection and pregnancy (in women)  Vital signs. See below for your results today.   Medications prescribed:   Take any prescribed medications only as directed.  Home care instructions:   Follow any educational materials contained in this packet.  Follow-up instructions: Please follow-up with your primary care provider in the next 2 days for further evaluation of your symptoms.    Return instructions:  SEEK IMMEDIATE MEDICAL ATTENTION IF:  The pain does not go away or becomes severe   A temperature above 101F develops   Repeated vomiting occurs (multiple episodes)   The pain becomes localized to portions of the abdomen. The right side could possibly be appendicitis. In an adult, the left lower portion of the abdomen could be colitis or diverticulitis.   Blood is being passed in stools or vomit (bright red or black tarry stools)   You develop chest pain, difficulty breathing, dizziness or fainting, or become confused, poorly responsive, or inconsolable (young children)  If you have any other emergent concerns regarding your health  Additional Information: Abdominal (belly) pain can be caused by many things. Your caregiver performed an examination and possibly ordered blood/urine tests and imaging (CT scan, x-rays, ultrasound). Many cases can be observed and treated at home after initial evaluation in the emergency department. Even though you are being discharged home, abdominal pain can be unpredictable. Therefore, you need a repeated exam if your pain does not resolve, returns, or worsens. Most patients with abdominal pain don't have to be admitted to the hospital or have surgery, but  serious problems like appendicitis and gallbladder attacks can start out as nonspecific pain. Many abdominal conditions cannot be diagnosed in one visit, so follow-up evaluations are very important.  Your vital signs today were: BP 121/98 mmHg   Pulse 78   Temp(Src) 98.8 F (37.1 C) (Oral)   Resp 17   SpO2 100%   LMP 02/01/2016 If your blood pressure (bp) was elevated above 135/85 this visit, please have this repeated by your doctor within one month. --------------

## 2016-10-06 LAB — OB RESULTS CONSOLE GBS: GBS: POSITIVE

## 2017-04-16 LAB — OB RESULTS CONSOLE HEPATITIS B SURFACE ANTIGEN: HEP B S AG: NEGATIVE

## 2017-04-16 LAB — OB RESULTS CONSOLE HIV ANTIBODY (ROUTINE TESTING): HIV: NONREACTIVE

## 2017-04-16 LAB — OB RESULTS CONSOLE RPR: RPR: NONREACTIVE

## 2017-04-16 LAB — OB RESULTS CONSOLE GC/CHLAMYDIA
CHLAMYDIA, DNA PROBE: NEGATIVE
Gonorrhea: NEGATIVE

## 2017-04-16 LAB — OB RESULTS CONSOLE RUBELLA ANTIBODY, IGM: RUBELLA: NON-IMMUNE/NOT IMMUNE

## 2017-04-26 ENCOUNTER — Emergency Department (HOSPITAL_COMMUNITY)
Admission: EM | Admit: 2017-04-26 | Discharge: 2017-04-26 | Disposition: A | Discharge status: Home / Self Care | Payer: 59 | Attending: Emergency Medicine | Admitting: Emergency Medicine

## 2017-04-26 ENCOUNTER — Encounter (HOSPITAL_COMMUNITY): Payer: Self-pay

## 2017-04-26 DIAGNOSIS — R10819 Abdominal tenderness, unspecified site: Secondary | ICD-10-CM | POA: Diagnosis not present

## 2017-04-26 DIAGNOSIS — S3690XA Unspecified injury of unspecified intra-abdominal organ, initial encounter: Secondary | ICD-10-CM | POA: Diagnosis not present

## 2017-04-26 DIAGNOSIS — Z3491 Encounter for supervision of normal pregnancy, unspecified, first trimester: Secondary | ICD-10-CM

## 2017-04-26 DIAGNOSIS — Y92410 Unspecified street and highway as the place of occurrence of the external cause: Secondary | ICD-10-CM | POA: Insufficient documentation

## 2017-04-26 DIAGNOSIS — Y999 Unspecified external cause status: Secondary | ICD-10-CM | POA: Diagnosis not present

## 2017-04-26 DIAGNOSIS — Z3A00 Weeks of gestation of pregnancy not specified: Secondary | ICD-10-CM | POA: Diagnosis not present

## 2017-04-26 DIAGNOSIS — Y939 Activity, unspecified: Secondary | ICD-10-CM | POA: Diagnosis not present

## 2017-04-26 DIAGNOSIS — Z79899 Other long term (current) drug therapy: Secondary | ICD-10-CM | POA: Diagnosis not present

## 2017-04-26 DIAGNOSIS — S29011A Strain of muscle and tendon of front wall of thorax, initial encounter: Secondary | ICD-10-CM | POA: Diagnosis not present

## 2017-04-26 DIAGNOSIS — R071 Chest pain on breathing: Secondary | ICD-10-CM | POA: Diagnosis present

## 2017-04-26 MED ORDER — ACETAMINOPHEN 325 MG PO TABS
650.0000 mg | ORAL_TABLET | Freq: Once | ORAL | Status: DC
  Filled 2017-04-26: qty 2
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
## 2017-05-03 DIAGNOSIS — M549 Dorsalgia, unspecified: Secondary | ICD-10-CM | POA: Diagnosis not present

## 2017-05-03 DIAGNOSIS — M542 Cervicalgia: Secondary | ICD-10-CM | POA: Diagnosis not present

## 2017-06-19 ENCOUNTER — Inpatient Hospital Stay (HOSPITAL_COMMUNITY): Payer: 59

## 2017-06-19 ENCOUNTER — Inpatient Hospital Stay (HOSPITAL_COMMUNITY)
Admission: AD | Admit: 2017-06-19 | Discharge: 2017-06-19 | Disposition: A | Discharge status: Home / Self Care | Payer: 59 | Source: Ambulatory Visit | Attending: Obstetrics and Gynecology | Admitting: Obstetrics and Gynecology

## 2017-06-19 ENCOUNTER — Encounter (HOSPITAL_COMMUNITY): Payer: Self-pay | Admitting: *Deleted

## 2017-06-19 ENCOUNTER — Other Ambulatory Visit: Payer: Self-pay

## 2017-06-19 DIAGNOSIS — O9989 Other specified diseases and conditions complicating pregnancy, childbirth and the puerperium: Secondary | ICD-10-CM | POA: Diagnosis not present

## 2017-06-19 DIAGNOSIS — O09522 Supervision of elderly multigravida, second trimester: Secondary | ICD-10-CM | POA: Diagnosis not present

## 2017-06-19 DIAGNOSIS — O4690 Antepartum hemorrhage, unspecified, unspecified trimester: Secondary | ICD-10-CM

## 2017-06-19 DIAGNOSIS — Z3A19 19 weeks gestation of pregnancy: Secondary | ICD-10-CM | POA: Diagnosis not present

## 2017-06-19 DIAGNOSIS — N930 Postcoital and contact bleeding: Secondary | ICD-10-CM | POA: Diagnosis not present

## 2017-06-19 DIAGNOSIS — O4692 Antepartum hemorrhage, unspecified, second trimester: Secondary | ICD-10-CM | POA: Insufficient documentation

## 2017-06-19 LAB — URINALYSIS, ROUTINE W REFLEX MICROSCOPIC
Bilirubin Urine: NEGATIVE
GLUCOSE, UA: NEGATIVE mg/dL
Ketones, ur: 5 mg/dL — AB
NITRITE: NEGATIVE
PH: 6 (ref 5.0–8.0)
Protein, ur: NEGATIVE mg/dL
SPECIFIC GRAVITY, URINE: 1.006 (ref 1.005–1.030)

## 2017-06-19 LAB — WET PREP, GENITAL
CLUE CELLS WET PREP: NONE SEEN
SPERM: NONE SEEN
Trich, Wet Prep: NONE SEEN
Yeast Wet Prep HPF POC: NONE SEEN

## 2017-06-19 NOTE — Discharge Instructions (Signed)
Pelvic Rest °Pelvic rest may be recommended if: °· Your placenta is partially or completely covering the opening of your cervix (placenta previa). °· There is bleeding between the wall of the uterus and the amniotic sac in the first trimester of pregnancy (subchorionic hemorrhage). °· You went into labor too early (preterm labor). ° °Based on your overall health and the health of your baby, your health care provider will decide if pelvic rest is right for you. °How do I rest my pelvis? °For as long as told by your health care provider: °· Do not have sex, sexual stimulation, or an orgasm. °· Do not use tampons. Do not douche. Do not put anything in your vagina. °· Do not lift anything that is heavier than 10 lb (4.5 kg). °· Avoid activities that take a lot of effort (are strenuous). °· Avoid any activity in which your pelvic muscles could become strained. ° °When should I seek medical care? °Seek medical care if you have: °· Cramping pain in your lower abdomen. °· Vaginal discharge. °· A low, dull backache. °· Regular contractions. °· Uterine tightening. ° °When should I seek immediate medical care? °Seek immediate medical care if: °· You have vaginal bleeding and you are pregnant. ° °This information is not intended to replace advice given to you by your health care provider. Make sure you discuss any questions you have with your health care provider. °Document Released: 11/14/2010 Document Revised: 12/26/2015 Document Reviewed: 01/21/2015 °Elsevier Interactive Patient Education © 2018 [employer] ° °

## 2017-06-19 NOTE — MAU Note (Signed)
Pt. Noticed vaginal bleeding at [employer] around noon; went to [place] and saw blood stain on underwear, and spotting on tissue when wipe. Pain 0-1/10, small amount of cramping. "Baby feels really low." Lots of pelvic pressure.  Doppler FHR 159 bpm.  Positive for fetal movement.

## 2017-06-19 NOTE — MAU Provider Note (Signed)
History     CSN: [id number]  Arrival date and time: 06/19/17 1438   First Provider Initiated Contact with Patient 06/19/17 1534      Chief Complaint  Patient presents with  . Vaginal Bleeding   HPI   Ms.Patricia Garrett  Is a 41 y.o. female [id number] @ 5512w3d here in MAU with vaginal bleeding. Was out shopping and felt a gush; says it was pretty significant. Had sex this morning so didn't think much about it. She went to another store and when she used the bathroom she noticed a blood stain on her underwear, and when she wiped she also saw the spotting. States the spotting was bright red. Some mild, lower pressure. No pain. + fetal movement.   OB History    Gravida Para Term Preterm AB Living   3 2 1 1   2    SAB TAB Ectopic Multiple Live Births           2      Past Medical History:  Diagnosis Date  . Anemia    DURING PREGNANCY  . Infection    HX FREQUESNT YEAST  . Pregnancy induced hypertension 2001  . Preterm labor 2001   35-36 weeksm, pre-eclampsia  . Vaginal delivery 12/19/2012    Past Surgical History:  Procedure Laterality Date  . WISDOM TOOTH EXTRACTION  2008    Family History  Problem Relation Age of Onset  . Heart disease Mother   . Stroke Mother     Social History   Tobacco Use  . Smoking status: Never Smoker  . Smokeless tobacco: Never Used  Substance Use Topics  . Alcohol use: No  . Drug use: No    Allergies: No Known Allergies  Medications Prior to Admission  Medication Sig Dispense Refill Last Dose  . IRON PO Take 10 mLs by mouth 2 (two) times daily.   06/19/2017 at Unknown time  . Multiple Vitamin (MULTIVITAMIN WITH MINERALS) TABS tablet Take 1 tablet by mouth daily.   06/19/2017 at Unknown time   Results for orders placed or performed during the hospital encounter of 06/19/17 (from the past 24 hour(s))  Urinalysis, Routine w reflex microscopic     Status: Abnormal   Collection Time: 06/19/17  4:00 PM  Result Value Ref Range   Color,  Urine AMBER (A) YELLOW   APPearance CLOUDY (A) CLEAR   Specific Gravity, Urine 1.006 1.005 - 1.030   pH 6.0 5.0 - 8.0   Glucose, UA NEGATIVE NEGATIVE mg/dL   Hgb urine dipstick LARGE (A) NEGATIVE   Bilirubin Urine NEGATIVE NEGATIVE   Ketones, ur 5 (A) NEGATIVE mg/dL   Protein, ur NEGATIVE NEGATIVE mg/dL   Nitrite NEGATIVE NEGATIVE   Leukocytes, UA TRACE (A) NEGATIVE   RBC / HPF TOO NUMEROUS TO COUNT 0 - 5 RBC/hpf   WBC, UA 6-30 0 - 5 WBC/hpf   Bacteria, UA RARE (A) NONE SEEN   Squamous Epithelial / LPF 6-30 (A) NONE SEEN   Mucus PRESENT   Wet prep, genital     Status: Abnormal   Collection Time: 06/19/17  4:00 PM  Result Value Ref Range   Yeast Wet Prep HPF POC NONE SEEN NONE SEEN   Trich, Wet Prep NONE SEEN NONE SEEN   Clue Cells Wet Prep HPF POC NONE SEEN NONE SEEN   WBC, Wet Prep HPF POC FEW (A) NONE SEEN   Sperm NONE SEEN    Review of Systems  Gastrointestinal: Negative for abdominal pain.  Genitourinary: Positive for vaginal bleeding and vaginal discharge. Negative for dysuria.  Neurological: Negative for dizziness.   Physical Exam   Blood pressure 130/84, pulse 75, temperature 98.2 F (36.8 C), temperature source Oral, resp. rate 18, height 5\' 8"  (1.727 m), weight 73.5 kg (162 lb), unknown if currently breastfeeding.  Physical Exam  Constitutional: She is oriented to person, place, and time. She appears well-developed and well-nourished. No distress.  HENT:  Head: Normocephalic.  Eyes: Pupils are equal, round, and reactive to light.  GI: Soft. She exhibits no distension. There is no tenderness. There is no rebound.  Genitourinary:  Genitourinary Comments: Vagina - Small, quarter size clot noted in the vaginal canal. Dark red blood noted on vaginal wall.  Cervix - No contact bleeding, no active bleeding  Bimanual exam: deferred  Wet prep done Chaperone present for exam.   Musculoskeletal: Normal range of motion.  Neurological: She is alert and oriented to person,  place, and time.  Skin: Skin is warm. She is not diaphoretic.  Psychiatric: Her behavior is normal.   MAU Course  Procedures  None  MDM  + fetal hear tones via doppler  Wet prep  US for cervical length, and placenta placement US Discussed US with Dr. Su Hiltoberts. Cervical length >3 cm. Placenta anterior above cervical os. Ok for DC home.   Assessment and Plan   A:  1. Vaginal bleeding during pregnancy   2. [redacted] weeks gestation of pregnancy   3. Postcoital bleeding     P:  Discharge home with strict return precautions Patient has US on 11/30; keep that appointment Call the office Monday to schedule a follow up  Pelvic rest Bleeding precautions Return to MAU if symptoms worsen    Khelani Kops, Harolyn RutherfordJennifer I, NP 06/19/2017 5:01 PM

## 2017-06-19 NOTE — MAU Note (Addendum)
Dr. Su Hiltoberts notified of triage patient, request MAU provider to see patient.  MAU provider notified.

## 2017-08-03 NOTE — L&D Delivery Note (Addendum)
Delivery Note At 7:03 PM a viable female was delivered via  (Presentation: cephalic and DOP).  APGAR: 8/9; weight  pending.   Placenta status: spont, intact.  Cord:  3V with the following complications: none.  Cord pH: n/a  Anesthesia:  epidural Episiotomy:  none Lacerations:  2nd degree perineal Suture Repair: 2.0 vicryl Est. Blood Loss (mL):  100 cc  Mom to postpartum.  Baby to Couplet care / Skin to Skin.  Patricia Garrett 11/10/2017, 7:29 PM

## 2017-08-11 DIAGNOSIS — Z369 Encounter for antenatal screening, unspecified: Secondary | ICD-10-CM | POA: Diagnosis not present

## 2017-10-06 LAB — OB RESULTS CONSOLE GBS: GBS: POSITIVE

## 2017-10-08 ENCOUNTER — Inpatient Hospital Stay (HOSPITAL_COMMUNITY)
Admission: AD | Admit: 2017-10-08 | Discharge: 2017-10-08 | Disposition: A | Discharge status: Home / Self Care | Payer: 59 | Source: Ambulatory Visit | Attending: Obstetrics & Gynecology | Admitting: Obstetrics & Gynecology

## 2017-10-08 DIAGNOSIS — O09523 Supervision of elderly multigravida, third trimester: Secondary | ICD-10-CM | POA: Diagnosis not present

## 2017-10-08 DIAGNOSIS — Z3A35 35 weeks gestation of pregnancy: Secondary | ICD-10-CM | POA: Diagnosis not present

## 2017-10-08 DIAGNOSIS — Z823 Family history of stroke: Secondary | ICD-10-CM | POA: Diagnosis not present

## 2017-10-08 DIAGNOSIS — R404 Transient alteration of awareness: Secondary | ICD-10-CM | POA: Diagnosis not present

## 2017-10-08 DIAGNOSIS — R110 Nausea: Secondary | ICD-10-CM | POA: Diagnosis present

## 2017-10-08 DIAGNOSIS — O218 Other vomiting complicating pregnancy: Secondary | ICD-10-CM | POA: Diagnosis not present

## 2017-10-08 DIAGNOSIS — O26893 Other specified pregnancy related conditions, third trimester: Secondary | ICD-10-CM | POA: Insufficient documentation

## 2017-10-08 DIAGNOSIS — R42 Dizziness and giddiness: Secondary | ICD-10-CM | POA: Insufficient documentation

## 2017-10-08 DIAGNOSIS — Z8249 Family history of ischemic heart disease and other diseases of the circulatory system: Secondary | ICD-10-CM | POA: Insufficient documentation

## 2017-10-08 LAB — COMPREHENSIVE METABOLIC PANEL
ALBUMIN: 3.3 g/dL — AB (ref 3.5–5.0)
ALK PHOS: 135 U/L — AB (ref 38–126)
ALT: 23 U/L (ref 14–54)
ANION GAP: 10 (ref 5–15)
AST: 28 U/L (ref 15–41)
BILIRUBIN TOTAL: 0.9 mg/dL (ref 0.3–1.2)
BUN: 9 mg/dL (ref 6–20)
CALCIUM: 9.2 mg/dL (ref 8.9–10.3)
CO2: 22 mmol/L (ref 22–32)
Chloride: 100 mmol/L — ABNORMAL LOW (ref 101–111)
Creatinine, Ser: 0.64 mg/dL (ref 0.44–1.00)
GFR calc Af Amer: >60 mL/min (ref >60)
GFR calc non Af Amer: >60 mL/min (ref >60)
GLUCOSE: 97 mg/dL (ref 65–99)
Potassium: 3.6 mmol/L (ref 3.5–5.1)
SODIUM: 132 mmol/L — AB (ref 135–145)
TOTAL PROTEIN: 7.3 g/dL (ref 6.5–8.1)

## 2017-10-08 LAB — CBC WITH DIFFERENTIAL/PLATELET
BASOS ABS: 0 10*3/uL (ref 0.0–0.1)
BASOS PCT: 0 %
EOS ABS: 0.1 10*3/uL (ref 0.0–0.7)
Eosinophils Relative: 1 %
HEMATOCRIT: 35.8 % — AB (ref 36.0–46.0)
HEMOGLOBIN: 12 g/dL (ref 12.0–15.0)
Lymphocytes Relative: 25 %
Lymphs Abs: 2.6 10*3/uL (ref 0.7–4.0)
MCH: 28 pg (ref 26.0–34.0)
MCHC: 33.5 g/dL (ref 30.0–36.0)
MCV: 83.6 fL (ref 78.0–100.0)
Monocytes Absolute: 0.2 10*3/uL (ref 0.1–1.0)
Monocytes Relative: 2 %
NEUTROS PCT: 72 %
Neutro Abs: 7.6 10*3/uL (ref 1.7–7.7)
Platelets: 181 10*3/uL (ref 150–400)
RBC: 4.28 MIL/uL (ref 3.87–5.11)
RDW: 16.7 % — ABNORMAL HIGH (ref 11.5–15.5)
WBC: 10.5 10*3/uL (ref 4.0–10.5)

## 2017-10-08 LAB — URINALYSIS, ROUTINE W REFLEX MICROSCOPIC
Bilirubin Urine: NEGATIVE
GLUCOSE, UA: NEGATIVE mg/dL
Hgb urine dipstick: NEGATIVE
KETONES UR: NEGATIVE mg/dL
Nitrite: NEGATIVE
PH: 8 (ref 5.0–8.0)
PROTEIN: NEGATIVE mg/dL
Specific Gravity, Urine: 1.012 (ref 1.005–1.030)

## 2017-10-08 LAB — PROTEIN / CREATININE RATIO, URINE
Creatinine, Urine: 79 mg/dL
PROTEIN CREATININE RATIO: 0.28 mg/mg{creat} — AB (ref 0.00–0.15)
Total Protein, Urine: 22 mg/dL

## 2017-10-08 MED ORDER — LACTATED RINGERS IV BOLUS (SEPSIS)
1000.0000 mL | Freq: Once | INTRAVENOUS | Status: AC
  Administered 2017-10-08: 1000 mL via INTRAVENOUS

## 2017-10-08 MED ORDER — ONDANSETRON 4 MG PO TBDP
4.0000 mg | ORAL_TABLET | Freq: Once | ORAL | Status: AC
  Administered 2017-10-08: 4 mg via ORAL
  Filled 2017-10-08: qty 1

## 2017-10-08 NOTE — MAU Note (Signed)
Elevated BP per EMS; 143/92 in MAU.  Denies headache; vision changes. Had pre-e in first pregnancy.

## 2017-10-08 NOTE — Progress Notes (Signed)
Informed Patricia Garrett, CNM of pt's BP. Asymptomatic and pre-E labs are all negative. Gave patient Pre-E education and d/c instructions.

## 2017-10-08 NOTE — Discharge Instructions (Signed)

## 2017-10-08 NOTE — MAU Note (Signed)
Sudden onset nausea, vomiting, hot flashes and dizziness around 1800.  Denies sick contact. Denies leaking or bleeding; +FM.

## 2017-10-08 NOTE — MAU Provider Note (Signed)
Chief Complaint:  Nausea and Dizziness   None    HPI: Patricia Garrett is a 42 y.o. [id number] at [redacted]w[redacted]d who presents to maternity admissions reporting dizziness and vomiting.  Pt states last meal was 1430.  Had a chocolate donut at 1700 and than got dizzy with rising and had hot flashes.  Pt states dizziness caused her to vomit.  Pt denies any prenatal complications.  Hx of ptd for preeclampsia with oligo  Location: dizziness Quality: mild  Duration: 4 hours Context: after eating sugar  Modifying factors: lying done. Associated signs and symptoms: hot flash  Denies contractions, leakage of fluid or vaginal bleeding. Good fetal movement.   Pregnancy Course:   Past Medical History:  Diagnosis Date  . Anemia    DURING PREGNANCY  . Infection    HX FREQUESNT YEAST  . Pregnancy induced hypertension 2001  . Preterm labor 2001   35-36 weeksm, pre-eclampsia  . Vaginal delivery 12/19/2012   OB History  Gravida Para Term Preterm AB Living  3 2 1 1   2   SAB TAB Ectopic Multiple Live Births          2    # Outcome Date GA Lbr Len/2nd Weight Sex Delivery Anes PTL Lv  3 Current           2 Term 12/19/12 [redacted]w[redacted]d 22:11 / 00:17 2.948 kg (6 lb 8 oz) M Vag-Spont None  LIV  1 Preterm 02/03/00 [redacted]w[redacted]d 16:00 2.58 kg (5 lb 11 oz) F Vag-Spont EPI Y LIV     Birth Comments: iol; oligo; preeclampsia     Past Surgical History:  Procedure Laterality Date  . WISDOM TOOTH EXTRACTION  2008   Family History  Problem Relation Age of Onset  . Heart disease Mother   . Stroke Mother    Social History   Tobacco Use  . Smoking status: Never Smoker  . Smokeless tobacco: Never Used  Substance Use Topics  . Alcohol use: No  . Drug use: No   No Known Allergies Medications Prior to Admission  Medication Sig Dispense Refill Last Dose  . IRON PO Take 10 mLs by mouth daily.    10/07/2017 at Unknown time  . Multiple Vitamin (MULTIVITAMIN WITH MINERALS) TABS tablet Take 1 tablet by mouth daily.   10/08/2017 at  Unknown time    I have reviewed patient's Past Medical Hx, Surgical Hx, Family Hx, Social Hx, medications and allergies.   ROS:  Review of Systems  Constitutional: Negative.   HENT: Positive for tinnitus.   Eyes: Negative.   Respiratory: Negative.   Cardiovascular: Negative.   Gastrointestinal: Positive for vomiting.  Endocrine: Negative.   Genitourinary: Negative.   Musculoskeletal: Negative.   Skin: Negative.   Allergic/Immunologic: Negative.   Neurological: Positive for dizziness.  Hematological: Negative.   Psychiatric/Behavioral: Negative.     Physical Exam   Patient Vitals for the past 24 hrs:  BP Temp Temp src Pulse Resp SpO2  10/08/17 2046 119/74 - - 68 - -  10/08/17 2000 - - - - - 100 %  10/08/17 1958 120/86 - - 73 - -  10/08/17 1955 - - - - - 98 %  10/08/17 1948 (!) 126/101 - - 78 - -  10/08/17 1945 - - - - - 100 %  10/08/17 1940 - - - - - 100 %  10/08/17 1935 - - - - - 99 %  10/08/17 1930 - - - - - 98 %  10/08/17 1923 Marland Kitchen)  143/92 97.6 F (36.4 C) Oral 88 16 -   Constitutional: Well-developed, well-nourished female in no acute distress.  Cardiovascular: normal rate Respiratory: normal effort GI: Abd soft, non-tender, gravid appropriate for gestational age. Pos BS x 4 MS: Extremities nontender, no edema, normal ROM Neurologic: Alert and oriented x 4.  GU: Neg CVAT.   FHT:  Baseline 120 , moderate variability, accelerations present, no decelerations Contractions:None   Labs: Results for orders placed or performed during the hospital encounter of 10/08/17 (from the past 24 hour(s))  CBC with Differential/Platelet     Status: Abnormal   Collection Time: 10/08/17  8:12 PM  Result Value Ref Range   WBC 10.5 4.0 - 10.5 K/uL   RBC 4.28 3.87 - 5.11 MIL/uL   Hemoglobin 12.0 12.0 - 15.0 g/dL   HCT 16.135.8 (L) 09.636.0 - 04.546.0 %   MCV 83.6 78.0 - 100.0 fL   MCH 28.0 26.0 - 34.0 pg   MCHC 33.5 30.0 - 36.0 g/dL   RDW 40.916.7 (H) 81.111.5 - 91.415.5 %   Platelets 181 150 - 400  K/uL   Neutrophils Relative % 72 %   Neutro Abs 7.6 1.7 - 7.7 K/uL   Lymphocytes Relative 25 %   Lymphs Abs 2.6 0.7 - 4.0 K/uL   Monocytes Relative 2 %   Monocytes Absolute 0.2 0.1 - 1.0 K/uL   Eosinophils Relative 1 %   Eosinophils Absolute 0.1 0.0 - 0.7 K/uL   Basophils Relative 0 %   Basophils Absolute 0.0 0.0 - 0.1 K/uL  Comprehensive metabolic panel     Status: Abnormal   Collection Time: 10/08/17  8:12 PM  Result Value Ref Range   Sodium 132 (L) 135 - 145 mmol/L   Potassium 3.6 3.5 - 5.1 mmol/L   Chloride 100 (L) 101 - 111 mmol/L   CO2 22 22 - 32 mmol/L   Glucose, Bld 97 65 - 99 mg/dL   BUN 9 6 - 20 mg/dL   Creatinine, Ser 7.820.64 0.44 - 1.00 mg/dL   Calcium 9.2 8.9 - 95.610.3 mg/dL   Total Protein 7.3 6.5 - 8.1 g/dL   Albumin 3.3 (L) 3.5 - 5.0 g/dL   AST 28 15 - 41 U/L   ALT 23 14 - 54 U/L   Alkaline Phosphatase 135 (H) 38 - 126 U/L   Total Bilirubin 0.9 0.3 - 1.2 mg/dL   GFR calc non Af Amer >60 >60 mL/min   GFR calc Af Amer >60 >60 mL/min   Anion gap 10 5 - 15  PCR .28  Imaging:  No results found.  MAU Course: Orders Placed This Encounter  Procedures  . CBC with Differential/Platelet  . Comprehensive metabolic panel  . Protein / creatinine ratio, urine   Meds ordered this encounter  Medications  . ondansetron (ZOFRAN-ODT) disintegrating tablet 4 mg  . lactated ringers bolus 1,000 mL    MDM: PE, labs and IV fluids. Given Zofran x one dose.  Pt dizziness gone.  Will give some pt some food and if stable to discharge home.  BP 120/80s/ Assessment: Dizziness in pregnancy Cat 1 strip  Plan: Discussed regular small meals with protein.  Limit sugar. Discussed orthostatic hypotension with getting up quickly and encouraged to push fluids.   Discharge home in stable condition.   Labor precautions and fetal kick counts  Reviewed pre eclampsia signs and symptoms.  Will have pt follow up in one week for evaluation.     Kenney Housemanrothero, Ezeriah Luty Jean, CNM 10/08/2017 9:22  PM

## 2017-11-10 ENCOUNTER — Inpatient Hospital Stay (HOSPITAL_COMMUNITY): Payer: 59 | Admitting: Anesthesiology

## 2017-11-10 ENCOUNTER — Other Ambulatory Visit: Payer: Self-pay

## 2017-11-10 ENCOUNTER — Inpatient Hospital Stay (HOSPITAL_COMMUNITY)
Admission: AD | Admit: 2017-11-10 | Discharge: 2017-11-12 | DRG: 807 | Disposition: A | Discharge status: Home / Self Care | Payer: 59 | Source: Ambulatory Visit | Attending: Obstetrics and Gynecology | Admitting: Obstetrics and Gynecology

## 2017-11-10 ENCOUNTER — Encounter (HOSPITAL_COMMUNITY): Payer: Self-pay

## 2017-11-10 DIAGNOSIS — R030 Elevated blood-pressure reading, without diagnosis of hypertension: Secondary | ICD-10-CM | POA: Diagnosis present

## 2017-11-10 DIAGNOSIS — Z3A40 40 weeks gestation of pregnancy: Secondary | ICD-10-CM

## 2017-11-10 DIAGNOSIS — O99824 Streptococcus B carrier state complicating childbirth: Secondary | ICD-10-CM | POA: Diagnosis not present

## 2017-11-10 DIAGNOSIS — O4292 Full-term premature rupture of membranes, unspecified as to length of time between rupture and onset of labor: Secondary | ICD-10-CM | POA: Diagnosis not present

## 2017-11-10 DIAGNOSIS — O133 Gestational [pregnancy-induced] hypertension without significant proteinuria, third trimester: Secondary | ICD-10-CM | POA: Diagnosis not present

## 2017-11-10 DIAGNOSIS — O26893 Other specified pregnancy related conditions, third trimester: Secondary | ICD-10-CM | POA: Diagnosis present

## 2017-11-10 DIAGNOSIS — O164 Unspecified maternal hypertension, complicating childbirth: Secondary | ICD-10-CM | POA: Diagnosis not present

## 2017-11-10 LAB — CBC
HCT: 36.9 % (ref 36.0–46.0)
HEMATOCRIT: 37.4 % (ref 36.0–46.0)
HEMATOCRIT: 39.3 % (ref 36.0–46.0)
HEMOGLOBIN: 12.2 g/dL (ref 12.0–15.0)
HEMOGLOBIN: 13.2 g/dL (ref 12.0–15.0)
Hemoglobin: 12.6 g/dL (ref 12.0–15.0)
MCH: 28.3 pg (ref 26.0–34.0)
MCH: 27.9 pg (ref 26.0–34.0)
MCH: 28.6 pg (ref 26.0–34.0)
MCHC: 33.7 g/dL (ref 30.0–36.0)
MCHC: 33.1 g/dL (ref 30.0–36.0)
MCHC: 33.6 g/dL (ref 30.0–36.0)
MCV: 83.9 fL (ref 78.0–100.0)
MCV: 84.4 fL (ref 78.0–100.0)
MCV: 85.2 fL (ref 78.0–100.0)
Platelets: 178 10*3/uL (ref 150–400)
Platelets: 168 10*3/uL (ref 150–400)
Platelets: 180 10*3/uL (ref 150–400)
RBC: 4.46 MIL/uL (ref 3.87–5.11)
RBC: 4.37 MIL/uL (ref 3.87–5.11)
RBC: 4.61 MIL/uL (ref 3.87–5.11)
RDW: 15.5 % (ref 11.5–15.5)
RDW: 15.4 % (ref 11.5–15.5)
RDW: 15.4 % (ref 11.5–15.5)
WBC: 8 10*3/uL (ref 4.0–10.5)
WBC: 9 10*3/uL (ref 4.0–10.5)
WBC: 13.7 10*3/uL — AB (ref 4.0–10.5)

## 2017-11-10 LAB — COMPREHENSIVE METABOLIC PANEL
ALK PHOS: 170 U/L — AB (ref 38–126)
ALT: 24 U/L (ref 14–54)
ANION GAP: 12 (ref 5–15)
AST: 34 U/L (ref 15–41)
Albumin: 3.3 g/dL — ABNORMAL LOW (ref 3.5–5.0)
BILIRUBIN TOTAL: 0.6 mg/dL (ref 0.3–1.2)
BUN: 20 mg/dL (ref 6–20)
CALCIUM: 9.5 mg/dL (ref 8.9–10.3)
CO2: 18 mmol/L — AB (ref 22–32)
Chloride: 102 mmol/L (ref 101–111)
Creatinine, Ser: 0.71 mg/dL (ref 0.44–1.00)
GFR calc non Af Amer: >60 mL/min (ref >60)
Glucose, Bld: 81 mg/dL (ref 65–99)
Potassium: 4.3 mmol/L (ref 3.5–5.1)
SODIUM: 132 mmol/L — AB (ref 135–145)
TOTAL PROTEIN: 7.3 g/dL (ref 6.5–8.1)

## 2017-11-10 LAB — PROTEIN / CREATININE RATIO, URINE
Creatinine, Urine: 86 mg/dL
PROTEIN CREATININE RATIO: 0.14 mg/mg{creat} (ref 0.00–0.15)
TOTAL PROTEIN, URINE: 12 mg/dL

## 2017-11-10 LAB — TYPE AND SCREEN
ABO/RH(D): O POS
Antibody Screen: NEGATIVE

## 2017-11-10 LAB — POCT FERN TEST: POCT Fern Test: POSITIVE

## 2017-11-10 LAB — RPR: RPR: NONREACTIVE

## 2017-11-10 MED ORDER — OXYCODONE-ACETAMINOPHEN 5-325 MG PO TABS: 2.0000 | ORAL_TABLET | ORAL | Status: DC | PRN

## 2017-11-10 MED ORDER — TERBUTALINE SULFATE 1 MG/ML IJ SOLN
0.2500 mg | Freq: Once | INTRAMUSCULAR | Status: DC | PRN
  Filled 2017-11-10: qty 1

## 2017-11-10 MED ORDER — OXYCODONE HCL 5 MG PO TABS: 5.0000 mg | ORAL_TABLET | ORAL | Status: DC | PRN

## 2017-11-10 MED ORDER — BENZOCAINE-MENTHOL 20-0.5 % EX AERO
1.0000 "application " | INHALATION_SPRAY | CUTANEOUS | Status: DC | PRN
  Filled 2017-11-10: qty 56

## 2017-11-10 MED ORDER — OXYTOCIN 40 UNITS IN LACTATED RINGERS INFUSION - SIMPLE MED
2.5000 [IU]/h | INTRAVENOUS | Status: DC
  Filled 2017-11-10: qty 1000

## 2017-11-10 MED ORDER — LACTATED RINGERS IV SOLN: 500.0000 mL | Freq: Once | INTRAVENOUS | Status: DC

## 2017-11-10 MED ORDER — PHENYLEPHRINE 40 MCG/ML (10ML) SYRINGE FOR IV PUSH (FOR BLOOD PRESSURE SUPPORT): 80.0000 ug | PREFILLED_SYRINGE | INTRAVENOUS | Status: DC | PRN

## 2017-11-10 MED ORDER — LIDOCAINE HCL (PF) 1 % IJ SOLN
30.0000 mL | INTRAMUSCULAR | Status: DC | PRN
Start: 2017-11-10 — End: 2017-11-10
  Filled 2017-11-10: qty 30

## 2017-11-10 MED ORDER — ZOLPIDEM TARTRATE 5 MG PO TABS
5.0000 mg | ORAL_TABLET | Freq: Every evening | ORAL | Status: DC | PRN
Start: 2017-11-10 — End: 2017-11-12

## 2017-11-10 MED ORDER — EPHEDRINE 5 MG/ML INJ
10.0000 mg | INTRAVENOUS | Status: DC | PRN
  Filled 2017-11-10: qty 2

## 2017-11-10 MED ORDER — DIPHENHYDRAMINE HCL 25 MG PO CAPS: 25.0000 mg | ORAL_CAPSULE | Freq: Four times a day (QID) | ORAL | Status: DC | PRN

## 2017-11-10 MED ORDER — OXYTOCIN 40 UNITS IN LACTATED RINGERS INFUSION - SIMPLE MED: 1.0000 m[IU]/min | INTRAVENOUS | Status: DC

## 2017-11-10 MED ORDER — PENICILLIN G POT IN DEXTROSE 60000 UNIT/ML IV SOLN
3.0000 10*6.[IU] | INTRAVENOUS | Status: DC
  Administered 2017-11-10 (×3): 3 10*6.[IU] via INTRAVENOUS
  Filled 2017-11-10 (×5): qty 50

## 2017-11-10 MED ORDER — IBUPROFEN 600 MG PO TABS
600.0000 mg | ORAL_TABLET | Freq: Four times a day (QID) | ORAL | Status: DC
  Filled 2017-11-10 (×3): qty 1

## 2017-11-10 MED ORDER — DIBUCAINE 1 % RE OINT: 1.0000 "application " | TOPICAL_OINTMENT | RECTAL | Status: DC | PRN

## 2017-11-10 MED ORDER — EPHEDRINE 5 MG/ML INJ: 10.0000 mg | INTRAVENOUS | Status: DC | PRN

## 2017-11-10 MED ORDER — COCONUT OIL OIL
1.0000 "application " | TOPICAL_OIL | Status: DC | PRN
  Administered 2017-11-11: 1 via TOPICAL
  Filled 2017-11-10: qty 120

## 2017-11-10 MED ORDER — SODIUM CHLORIDE 0.9 % IV SOLN
5.0000 10*6.[IU] | Freq: Once | INTRAVENOUS | Status: AC
  Administered 2017-11-10: 5 10*6.[IU] via INTRAVENOUS
  Filled 2017-11-10: qty 5

## 2017-11-10 MED ORDER — ACETAMINOPHEN 325 MG PO TABS: 650.0000 mg | ORAL_TABLET | ORAL | Status: DC | PRN

## 2017-11-10 MED ORDER — SENNOSIDES-DOCUSATE SODIUM 8.6-50 MG PO TABS
2.0000 | ORAL_TABLET | ORAL | Status: DC
  Filled 2017-11-10: qty 2

## 2017-11-10 MED ORDER — SIMETHICONE 80 MG PO CHEW: 80.0000 mg | CHEWABLE_TABLET | ORAL | Status: DC | PRN

## 2017-11-10 MED ORDER — SOD CITRATE-CITRIC ACID 500-334 MG/5ML PO SOLN: 30.0000 mL | ORAL | Status: DC | PRN

## 2017-11-10 MED ORDER — ONDANSETRON HCL 4 MG PO TABS: 4.0000 mg | ORAL_TABLET | ORAL | Status: DC | PRN

## 2017-11-10 MED ORDER — ONDANSETRON HCL 4 MG/2ML IJ SOLN: 4.0000 mg | INTRAMUSCULAR | Status: DC | PRN

## 2017-11-10 MED ORDER — FLEET ENEMA 7-19 GM/118ML RE ENEM: 1.0000 | ENEMA | RECTAL | Status: DC | PRN

## 2017-11-10 MED ORDER — PHENYLEPHRINE 40 MCG/ML (10ML) SYRINGE FOR IV PUSH (FOR BLOOD PRESSURE SUPPORT)
80.0000 ug | PREFILLED_SYRINGE | INTRAVENOUS | Status: DC | PRN
  Filled 2017-11-10: qty 10
  Filled 2017-11-10: qty 5

## 2017-11-10 MED ORDER — DIPHENHYDRAMINE HCL 50 MG/ML IJ SOLN: 12.5000 mg | INTRAMUSCULAR | Status: DC | PRN

## 2017-11-10 MED ORDER — OXYTOCIN BOLUS FROM INFUSION
500.0000 mL | Freq: Once | INTRAVENOUS | Status: AC
  Administered 2017-11-10: 500 mL via INTRAVENOUS

## 2017-11-10 MED ORDER — LACTATED RINGERS IV SOLN
INTRAVENOUS | Status: DC
  Administered 2017-11-10 (×2): via INTRAVENOUS

## 2017-11-10 MED ORDER — ONDANSETRON HCL 4 MG/2ML IJ SOLN: 4.0000 mg | Freq: Four times a day (QID) | INTRAMUSCULAR | Status: DC | PRN

## 2017-11-10 MED ORDER — LACTATED RINGERS IV SOLN: 500.0000 mL | INTRAVENOUS | Status: DC | PRN

## 2017-11-10 MED ORDER — OXYCODONE-ACETAMINOPHEN 5-325 MG PO TABS: 1.0000 | ORAL_TABLET | ORAL | Status: DC | PRN

## 2017-11-10 MED ORDER — PRENATAL MULTIVITAMIN CH
1.0000 | ORAL_TABLET | Freq: Every day | ORAL | Status: DC
  Filled 2017-11-10: qty 1

## 2017-11-10 MED ORDER — FENTANYL CITRATE (PF) 100 MCG/2ML IJ SOLN: 100.0000 ug | INTRAMUSCULAR | Status: DC | PRN

## 2017-11-10 MED ORDER — PHENYLEPHRINE 40 MCG/ML (10ML) SYRINGE FOR IV PUSH (FOR BLOOD PRESSURE SUPPORT)
80.0000 ug | PREFILLED_SYRINGE | INTRAVENOUS | Status: DC | PRN
  Filled 2017-11-10: qty 5

## 2017-11-10 MED ORDER — FENTANYL 2.5 MCG/ML BUPIVACAINE 1/10 % EPIDURAL INFUSION (WH - ANES)
14.0000 mL/h | INTRAMUSCULAR | Status: DC | PRN
  Administered 2017-11-10: 14 mL/h via EPIDURAL
  Filled 2017-11-10: qty 100

## 2017-11-10 MED ORDER — TETANUS-DIPHTH-ACELL PERTUSSIS 5-2.5-18.5 LF-MCG/0.5 IM SUSP
0.5000 mL | Freq: Once | INTRAMUSCULAR | Status: DC
Start: 2017-11-11 — End: 2017-11-12

## 2017-11-10 MED ORDER — LIDOCAINE HCL (PF) 1 % IJ SOLN
INTRAMUSCULAR | Status: DC | PRN
  Administered 2017-11-10: 4 mL via EPIDURAL

## 2017-11-10 MED ORDER — WITCH HAZEL-GLYCERIN EX PADS: 1.0000 "application " | MEDICATED_PAD | CUTANEOUS | Status: DC | PRN

## 2017-11-10 MED ORDER — OXYTOCIN 40 UNITS IN LACTATED RINGERS INFUSION - SIMPLE MED
1.0000 m[IU]/min | INTRAVENOUS | Status: DC
  Administered 2017-11-10: 1 m[IU]/min via INTRAVENOUS
  Filled 2017-11-10: qty 1000

## 2017-11-10 NOTE — Progress Notes (Addendum)
Patricia Garrett is a 42 y.o. G3P1102 at 5539w0d   Subjective: No complaints  Objective: BP (!) 151/105   Pulse 76   Temp (!) 97.4 F (36.3 C) (Oral)   Resp 18   Ht 5\' 8"  (1.727 m)   Wt 182 lb (82.6 kg)   SpO2 98%   BMI 27.67 kg/m  No intake/output data recorded. No intake/output data recorded.  FHT:  FHR: 130 bpm, variability: moderate,  accelerations:  Present,  decelerations:  Absent UC:   regular, every 2-4 minutes SVE:   Dilation: 5 Effacement (%): 100 Station: -1 Exam by:: Dr.Mahiya Kercheval  Labs: Lab Results  Component Value Date   WBC 9.0 11/10/2017   HGB 12.2 11/10/2017   HCT 36.9 11/10/2017   MCV 84.4 11/10/2017   PLT 168 11/10/2017    Assessment / Plan: Augmentation of labor, progressing well  Labor: Progressing normally Preeclampsia:  no signs or symptoms of toxicity Fetal Wellbeing:  Category I Pain Control:  Epidural I/D:  GBS+ on PCN Anticipated MOD:  NSVD  Patricia Garrett 11/10/2017, 7:20 PM

## 2017-11-10 NOTE — Anesthesia Preprocedure Evaluation (Signed)
Anesthesia Evaluation  Patient identified by MRN, date of birth, ID band Patient awake    Reviewed: Allergy & Precautions, Patient's Chart, lab work & pertinent test results  Airway Mallampati: I       Dental no notable dental hx.    Pulmonary    Pulmonary exam normal        Cardiovascular hypertension, Normal cardiovascular exam     Neuro/Psych negative neurological ROS  negative psych ROS   GI/Hepatic negative GI ROS, Neg liver ROS,   Endo/Other  negative endocrine ROS  Renal/GU negative Renal ROS     Musculoskeletal negative musculoskeletal ROS (+)   Abdominal   Peds  Hematology   Anesthesia Other Findings   Reproductive/Obstetrics (+) Pregnancy                             Anesthesia Physical Anesthesia Plan  ASA: II  Anesthesia Plan: Epidural   Post-op Pain Management:    Induction:   PONV Risk Score and Plan:   Airway Management Planned:   Additional Equipment:   Intra-op Plan:   Post-operative Plan:   Informed Consent: I have reviewed the patients History and Physical, chart, labs and discussed the procedure including the risks, benefits and alternatives for the proposed anesthesia with the patient or authorized representative who has indicated his/her understanding and acceptance.     Plan Discussed with:   Anesthesia Plan Comments: (Lab Results      Component                Value               Date                      WBC                      8.0                 11/10/2017                HGB                      12.6                11/10/2017                HCT                      37.4                11/10/2017                MCV                      83.9                11/10/2017                PLT                      178                 11/10/2017            Repeat labs pending. )        Anesthesia Quick Evaluation

## 2017-11-10 NOTE — Progress Notes (Signed)
Patricia Garrett is a 42 y.o. G3P1102 at 6344w0d   Subjective: Comfortable.  Reports clear fluid still leaking with blood tinge and contractions every 8 min or so.  She is considering starting pitocin soon.  Objective: BP (!) 127/96   Pulse 67   Temp 98.1 F (36.7 C) (Oral)   Resp 16   Ht 5\' 8"  (1.727 m)   Wt 182 lb (82.6 kg)   SpO2 99% Comment: room air  BMI 27.67 kg/m  No intake/output data recorded. No intake/output data recorded.  FHT:  FHR: 130 bpm, variability: moderate,  accelerations:  Present,  decelerations:  Absent UC:   irregular, every 8 minutes or more but not picking up well SVE:   Dilation: 3 Effacement (%): 70 Station: -3 Exam by:: Raliegh Ipatherine Stout RN  Labs: Lab Results  Component Value Date   WBC 8.0 11/10/2017   HGB 12.6 11/10/2017   HCT 37.4 11/10/2017   MCV 83.9 11/10/2017   PLT 178 11/10/2017    Assessment / Plan: SROM with spaced out contractions but has managed to go from 1cm to 3cm since arrival  Labor: Progressing normally Preeclampsia:  no signs or symptoms of toxicity Fetal Wellbeing:  Category I Pain Control:  Labor support without medications I/D:  GBS + on PCN Anticipated MOD:  NSVD  Patricia Garrett 11/10/2017, 10:35 AM

## 2017-11-10 NOTE — Anesthesia Pain Management Evaluation Note (Signed)
  [facility] Pain Management Visit Note  Patient: Patricia Garrett, 42 y.o., female  "Hello [facility] am [facility]. We have an anesthesia team available at all times to provide care throughout the hospital, including epidural management and anesthesia for C-section. I don't know your plan for the delivery whether it a natural birth, water birth, IV sedation, nitrous supplementation, doula or epidural, but we want to meet your pain goals."   1.Was your pain managed to your expectations on prior hospitalizations?   Yes   2.What is your expectation for pain management during this hospitalization?     Labor support without medications, Epidural, IV pain meds and Nitrous Oxide  3.How can we help you reach that goal? Pt open to discussion about all methods of pain management. Questions answered.  Record the patient's initial score and the patient's pain goal.   Pain: 3  Pain Goal: 10 [facility] wants you to be able to say your pain was always managed very well.  Emmalou Hunger 11/10/2017

## 2017-11-10 NOTE — Progress Notes (Addendum)
Patricia Garrett is a 42 y.o. G3P1102 at 3269w0d   Subjective: No complaints  Objective: BP (!) 132/95   Pulse 65   Temp 98.1 F (36.7 C) (Oral)   Resp 20   Ht 5\' 8"  (1.727 m)   Wt 182 lb (82.6 kg)   SpO2 99% Comment: room air  BMI 27.67 kg/m  No intake/output data recorded. No intake/output data recorded.  FHT:  FHR: 135 bpm, variability: moderate,  accelerations:  Present,  decelerations:  Absent UC:   irregular, every 2-8 minutes SVE:   Dilation: 3 Effacement (%): 70 Station: -3 Exam by:: Dr. Charity Tessier(orders to start Pitocin, discussing with pt) Cvx exam is unchanged and I would call her a 1-2cm/50/-3 and very posterior  Labs: Lab Results  Component Value Date   WBC 8.0 11/10/2017   HGB 12.6 11/10/2017   HCT 37.4 11/10/2017   MCV 83.9 11/10/2017   PLT 178 11/10/2017    Assessment / Plan: SROM not in labor ruptured for 12hrs and GBS +.  I discussed recommendation to start pitocin and pt is agreeable now to proceed.   Labor: will augment with pitocin per protocol (1x1) Preeclampsia:  no signs or symptoms of toxicity Fetal Wellbeing:  Category I Pain Control:  Labor support without medications but I discussed all options and let her know they are all available to her (IV fentanyl, nitrous oxide and epidural) I/D:  GBS + on PCN Anticipated MOD:  NSVD  Patricia Garrett 11/10/2017, 1:29 PM  1420 Will reevalute cervix once pt has been contracting regularly at least q 3-5 min for over an hour.  FHT 135, + accels, no decels, moderate variability.  Cat 1/Reactive tracing.

## 2017-11-10 NOTE — MAU Note (Signed)
Pt report given to Caprice Renshawebra Callaway, RN

## 2017-11-10 NOTE — Anesthesia Procedure Notes (Signed)
Epidural Patient location during procedure: OB Start time: 11/10/2017 5:13 PM End time: 11/10/2017 5:19 PM  Staffing Anesthesiologist: Shelton SilvasHollis, Temika Sutphin D, MD Performed: anesthesiologist   Preanesthetic Checklist Completed: patient identified, site marked, surgical consent, pre-op evaluation, timeout performed, IV checked, risks and benefits discussed and monitors and equipment checked  Epidural Patient position: sitting Prep: ChloraPrep Patient monitoring: heart rate, continuous pulse ox and blood pressure Approach: midline Location: L3-L4 Injection technique: LOR saline  Needle:  Needle type: Tuohy  Needle gauge: 17 G Needle length: 9 cm Catheter type: closed end flexible Catheter size: 20 Guage Test dose: negative and 1.5% lidocaine  Assessment Events: blood not aspirated, injection not painful, no injection resistance and no paresthesia  Additional Notes LOR @ 4.5  Patient identified. Risks/Benefits/Options discussed with patient including but not limited to bleeding, infection, nerve damage, paralysis, failed block, incomplete pain control, headache, blood pressure changes, nausea, vomiting, reactions to medications, itching and postpartum back pain. Confirmed with bedside nurse the patient's most recent platelet count. Confirmed with patient that they are not currently taking any anticoagulation, have any bleeding history or any family history of bleeding disorders. Patient expressed understanding and wished to proceed. All questions were answered. Sterile technique was used throughout the entire procedure. Please see nursing notes for vital signs. Test dose was given through epidural catheter and negative prior to continuing to dose epidural or start infusion. Warning signs of high block given to the patient including shortness of breath, tingling/numbness in hands, complete motor block, or any concerning symptoms with instructions to call for help. Patient was given instructions  on fall risk and not to get out of bed. All questions and concerns addressed with instructions to call with any issues or inadequate analgesia.    Reason for block:procedure for pain

## 2017-11-10 NOTE — H&P (Signed)
Patricia Sherryl MangesC Otten is a 42 y.o. female [id number] @ 6177w0d presenting for SROM. Fern POsitive per MAU rn. Elevated B/P's in Triage max 137/106. History of preeclampsia with first pregnancy. OB History    Gravida  3   Para  2   Term  1   Preterm  1   AB      Living  2     SAB      TAB      Ectopic      Multiple      Live Births  2          Past Medical History:  Diagnosis Date  . Anemia    DURING PREGNANCY  . Infection    HX FREQUESNT YEAST  . Pregnancy induced hypertension 2001  . Preterm labor 2001   35-36 weeksm, pre-eclampsia  . Vaginal delivery 12/19/2012   Past Surgical History:  Procedure Laterality Date  . WISDOM TOOTH EXTRACTION  2008   Family History: family history includes Heart disease in her mother; Stroke in her mother. Social History:  reports that she has never smoked. She has never used smokeless tobacco. She reports that she does not drink alcohol or use drugs.     Maternal Diabetes: no Genetic Screening:  Maternal Ultrasounds/Referrals: wnl Fetal Ultrasounds or other Referrals:   Maternal Substance Abuse: no Significant Maternal Medications:  Significant Maternal Lab Results:  GBS positive Other Comments:   Review of Systems  Genitourinary:       SROM clear fluid  All other systems reviewed and are negative.  Maternal Medical History:  Reason for admission: Rupture of membranes.   Contractions: Onset was 3-5 hours ago.   Frequency: regular.   Perceived severity is mild.    Fetal activity: Perceived fetal activity is normal.   Last perceived fetal movement was within the past hour.    Prenatal complications: PIH.   Prenatal Complications - Diabetes: none.    Dilation: 1 Effacement (%): 60 Station: -2 Exam by:: Caprice Renshawebra Callaway, RN  Blood pressure (!) 136/94, pulse 73, temperature 97.9 F (36.6 C), temperature source Oral, resp. rate 18, height 5\' 8"  (1.727 m), weight 182 lb (82.6 kg), SpO2 99 %, unknown if currently  breastfeeding. Maternal Exam:  Uterine Assessment: Contraction strength is mild.  Contraction frequency is regular.   Abdomen: Patient reports no abdominal tenderness. Fetal presentation: vertex  Introitus: Ferning test: positive.  Amniotic fluid character: clear.  Pelvis: adequate for delivery.      Fetal Exam Fetal Monitor Review: Mode: ultrasound.   Variability: moderate (6-25 bpm).   Pattern: accelerations present.    Fetal State Assessment: Category I - tracings are normal.     Physical Exam  Nursing note and vitals reviewed. Constitutional: She is oriented to person, place, and time. She appears well-developed and well-nourished. No distress.  HENT:  Head: Normocephalic and atraumatic.  Neck: Normal range of motion.  Cardiovascular: Normal rate.  Respiratory: Effort normal. No respiratory distress.  GI: Soft. There is no tenderness.  Neurological: She is alert and oriented to person, place, and time.  Skin: Skin is warm and dry.  Psychiatric: She has a normal mood and affect. Her behavior is normal.    Prenatal labs: ABO, Rh: --/--/O POS (04/10 0250) Antibody: NEG (04/10 0250) Rubella:  not immune RPR:   nr HBsAg:   neg HIV:   neg GBS:   positive Results for orders placed or performed during the hospital encounter of 11/10/17 (from the past 24  hour(s))  Comprehensive metabolic panel     Status: Abnormal   Collection Time: 11/10/17  2:50 AM  Result Value Ref Range   Sodium 132 (L) 135 - 145 mmol/L   Potassium 4.3 3.5 - 5.1 mmol/L   Chloride 102 101 - 111 mmol/L   CO2 18 (L) 22 - 32 mmol/L   Glucose, Bld 81 65 - 99 mg/dL   BUN 20 6 - 20 mg/dL   Creatinine, Ser 1.61 0.44 - 1.00 mg/dL   Calcium 9.5 8.9 - 09.6 mg/dL   Total Protein 7.3 6.5 - 8.1 g/dL   Albumin 3.3 (L) 3.5 - 5.0 g/dL   AST 34 15 - 41 U/L   ALT 24 14 - 54 U/L   Alkaline Phosphatase 170 (H) 38 - 126 U/L   Total Bilirubin 0.6 0.3 - 1.2 mg/dL   GFR calc non Af Amer >60 >60 mL/min   GFR calc  Af Amer >60 >60 mL/min   Anion gap 12 5 - 15  CBC     Status: None   Collection Time: 11/10/17  2:50 AM  Result Value Ref Range   WBC 8.0 4.0 - 10.5 K/uL   RBC 4.46 3.87 - 5.11 MIL/uL   Hemoglobin 12.6 12.0 - 15.0 g/dL   HCT 04.5 40.9 - 81.1 %   MCV 83.9 78.0 - 100.0 fL   MCH 28.3 26.0 - 34.0 pg   MCHC 33.7 30.0 - 36.0 g/dL   RDW 91.4 78.2 - 95.6 %   Platelets 178 150 - 400 K/uL  Type and screen [facility]     Status: None   Collection Time: 11/10/17  2:50 AM  Result Value Ref Range   ABO/RH(D) O POS    Antibody Screen NEG    Sample Expiration      11/13/2017 Performed at [facility], [address] [place], [place] [address]   Crist Fat Test     Status: None   Collection Time: 11/10/17  2:55 AM  Result Value Ref Range   POCT Fern Test Positive = ruptured amniotic membanes   Protein / creatinine ratio, urine     Status: None   Collection Time: 11/10/17  3:35 AM  Result Value Ref Range   Creatinine, Urine 86.00 mg/dL   Total Protein, Urine 12 mg/dL   Protein Creatinine Ratio 0.14 0.00 - 0.15 mg/mg[Cre]   Assessment/Plan: Admit to L/D Routine orders Offered Pitocin for augmentation and pt declines until she has been ruptured for 12 hours Expectant Management' GBS Prophylaxis Anticipate Vaginal Delivery   Lovel Suazo A Zamyah Wiesman CNM 11/10/2017, 6:49 AM

## 2017-11-11 LAB — COMPREHENSIVE METABOLIC PANEL
ALBUMIN: 2.7 g/dL — AB (ref 3.5–5.0)
ALK PHOS: 129 U/L — AB (ref 38–126)
ALT: 23 U/L (ref 14–54)
ANION GAP: 9 (ref 5–15)
AST: 33 U/L (ref 15–41)
BUN: 10 mg/dL (ref 6–20)
CALCIUM: 8.9 mg/dL (ref 8.9–10.3)
CO2: 21 mmol/L — ABNORMAL LOW (ref 22–32)
Chloride: 105 mmol/L (ref 101–111)
Creatinine, Ser: 0.65 mg/dL (ref 0.44–1.00)
GFR calc non Af Amer: >60 mL/min (ref >60)
GLUCOSE: 75 mg/dL (ref 65–99)
POTASSIUM: 4.3 mmol/L (ref 3.5–5.1)
Sodium: 135 mmol/L (ref 135–145)
TOTAL PROTEIN: 6.5 g/dL (ref 6.5–8.1)
Total Bilirubin: 0.7 mg/dL (ref 0.3–1.2)

## 2017-11-11 LAB — CBC
HEMATOCRIT: 35.3 % — AB (ref 36.0–46.0)
Hemoglobin: 11.8 g/dL — ABNORMAL LOW (ref 12.0–15.0)
MCH: 27.8 pg (ref 26.0–34.0)
MCHC: 33.4 g/dL (ref 30.0–36.0)
MCV: 83.3 fL (ref 78.0–100.0)
Platelets: 186 10*3/uL (ref 150–400)
RBC: 4.24 MIL/uL (ref 3.87–5.11)
RDW: 15.3 % (ref 11.5–15.5)
WBC: 11.1 10*3/uL — ABNORMAL HIGH (ref 4.0–10.5)

## 2017-11-11 NOTE — Progress Notes (Signed)
Pt blood pressure 122/91 and 123/87. Pt has no complaints of blurred vision, epigastric pain, or headache. Dr Estanislado Pandyivard notified. No new orders. Will continue to monitor pt.

## 2017-11-11 NOTE — Progress Notes (Signed)
Subjective: Postpartum Day 1: Vaginal delivery,  2nd laceration Patient up ad lib, reports no syncope or dizziness. Feeding:  Breast Contraceptive plan:  undecided  Objective: Vital signs in last 24 hours: Temp:  [97.4 F (36.3 C)-98.1 F (36.7 C)] 97.9 F (36.6 C) (04/11 0514) Pulse Rate:  [58-84] 72 (04/11 0514) Resp:  [16-20] 20 (04/11 0200) BP: (117-151)/(85-105) 125/85 (04/11 0514) SpO2:  [98 %-100 %] 100 % (04/11 0514)   Physical Exam:  General: alert, cooperative and no distress Lochia: appropriate  Uterine Fundus: firm Perineum: healing well DVT Evaluation: No evidence of DVT seen on physical exam.   CBC Latest Ref Rng & Units 11/11/2017 11/10/2017 11/10/2017  WBC 4.0 - 10.5 K/uL 11.1(H) 13.7(H) 9.0  Hemoglobin 12.0 - 15.0 g/dL 11.8(L) 13.2 12.2  Hematocrit 36.0 - 46.0 % 35.3(L) 39.3 36.9  Platelets 150 - 400 K/uL 186 180 168     Assessment/Plan: Status post vaginal delivery day 1. Stable Continue current care. Plan for discharge tomorrow and Lactation consult    Henderson Newcomerancy Jean ProtheroCNM 11/11/2017, 8:18 AM

## 2017-11-11 NOTE — Anesthesia Postprocedure Evaluation (Signed)
Anesthesia Post Note  Patient: Patricia Garrett  Procedure(s) Performed: AN AD HOC LABOR EPIDURAL     Patient location during evaluation: Mother Baby Anesthesia Type: Epidural Level of consciousness: awake, awake and alert and oriented Pain management: pain level controlled Vital Signs Assessment: post-procedure vital signs reviewed and stable Respiratory status: spontaneous breathing, respiratory function stable and nonlabored ventilation Cardiovascular status: stable Postop Assessment: no headache, no backache, adequate PO intake, patient able to bend at knees and no apparent nausea or vomiting Anesthetic complications: no    Last Vitals:  Vitals:   11/11/17 0514 11/11/17 0905  BP: 125/85 130/86  Pulse: 72 68  Resp:  18  Temp: 36.6 C 37.2 C  SpO2: 100%     Last Pain:  Vitals:   11/11/17 1005  TempSrc:   PainSc: 2    Pain Goal: Patients Stated Pain Goal: 2 (11/11/17 1005)               Janmarie Smoot

## 2017-11-11 NOTE — Lactation Note (Signed)
This note was copied from a baby's chart. Lactation Consultation Note  Patient Name: Patricia Garrett KettleCrystal Rinkenberger Today's Date: 11/11/2017    LC initial visit:  G2P2 mother whose infant is now 3113 hours old.  Mother has a 306 year old at home who breastfed for approximately 9 months.  Infant is awake so LC offered to assist with latch.  Attempted to latch in the cross cradle hold on the left breast but infant very sleepy and uninterested at this time.  LC encouraged a lot of STS today and to watch for feeding cues.  Reassured mother that this is very normal for his age.  Reviewed feeding 8-12 times/24 hours, breast massage, hand expression (with return demonstration), and to call RN for assistance if needed.  Infant has already had 1 BM and 0 voids, however, it is still early for this.  Reviewed voiding/stooling pattern with mother.  Mom made aware of O/P services, breastfeeding support groups, community resources, and our phone # for post-discharge questions.                   Shanah Guimaraes R Malita Ignasiak 11/11/2017, 8:25 AM

## 2017-11-12 MED ORDER — NIFEDIPINE ER OSMOTIC RELEASE 30 MG PO TB24
30.0000 mg | ORAL_TABLET | Freq: Every day | ORAL | Status: DC
  Filled 2017-11-12: qty 1

## 2017-11-12 NOTE — Progress Notes (Signed)
Pt requests privacy during the night. Pt stated she did not want anyone in the room unless staff needed to complete a necessary task.

## 2017-11-12 NOTE — Progress Notes (Signed)
BP remains elevated. Patient does not want to take Procardia as ordered per MD due to not wanting to take medications while breastfeeding. Will have LC see patient and research compatibility of medication for patient. Patient has requested to have more BP checks before deciding on medication. Declined Motrin, as well.

## 2017-11-12 NOTE — Progress Notes (Signed)
   11/12/17 0650 11/12/17 0651  Vital Signs  BP (!) 135/105 (!) 139/99  BP Location Right Arm Left Arm  Patient Position (if appropriate) Sitting Sitting  BP Method Automatic Automatic  Pulse Rate 72 68  Bernerd PhoNancy Prothero, nurse midwife, was notified of elevated blood pressures. No headache, blurred vision, or epigastric pain. Nurse midwife stated to recheck blood pressures in one hour.

## 2017-11-12 NOTE — Discharge Summary (Addendum)
OB Discharge Summary     Patient Name: Patricia Garrett DOB: 1976-01-17 MRN: [id number]  Date of admission: 11/10/2017 Delivering MD: Osborn Coho   Date of discharge: 11/12/2017  Admitting diagnosis: 40 WEEKS ROM Intrauterine pregnancy: [redacted]w[redacted]d     Secondary diagnosis:  Active Problems:   Indication for care in labor and delivery, antepartum  Additional problems: None     Discharge diagnosis: Term Pregnancy Delivered                                                                                                Post partum procedures:None  Augmentation: Pitocin  Complications: None  Hospital course:  Onset of Labor With Vaginal Delivery     42 y.o. yo [id number] at [redacted]w[redacted]d was admitted in Active Labor on 11/10/2017. Patient had an uncomplicated labor course as follows:  Membrane Rupture Time/Date: 1:00 AM ,11/10/2017   Intrapartum Procedures: Episiotomy: None [1]                                         Lacerations:  2nd degree [3]  Patient had a delivery of a Viable infant. 11/10/2017  Information for the patient's newborn:  Maday, Guarino [[id number]]  Delivery Method: Vaginal, Spontaneous(Filed from Delivery Summary)    [name] had an uncomplicated postpartum course.  She is ambulating, tolerating a regular diet, passing flatus, and urinating well. Patient is discharged home in stable condition on 11/12/17.b  She had some elevated pressures during her admission but preeclampsia labs were all normal.    Physical exam  Vitals:   11/12/17 0828 11/12/17 0830 11/12/17 1100 11/12/17 1230  BP: (!) 124/103 130/87 (!) 136/94 123/88  Pulse: 73  66   Resp: 18  18   Temp: 99.4 F (37.4 C)  99.3 F (37.4 C)   TempSrc: Oral  Oral   SpO2:      Weight:      Height:       General: alert, cooperative and no distress Lochia: appropriate Uterine Fundus: firm Incision: N/A DVT Evaluation: No evidence of DVT seen on physical exam. Negative Homan's sign. Labs: Lab Results   Component Value Date   WBC 11.1 (H) 11/11/2017   HGB 11.8 (L) 11/11/2017   HCT 35.3 (L) 11/11/2017   MCV 83.3 11/11/2017   PLT 186 11/11/2017   CMP Latest Ref Rng & Units 11/11/2017  Glucose 65 - 99 mg/dL 75  BUN 6 - 20 mg/dL 10  Creatinine 1.30 - 8.65 mg/dL 7.84  Sodium 696 - 295 mmol/L 135  Potassium 3.5 - 5.1 mmol/L 4.3  Chloride 101 - 111 mmol/L 105  CO2 22 - 32 mmol/L 21(L)  Calcium 8.9 - 10.3 mg/dL 8.9  Total Protein 6.5 - 8.1 g/dL 6.5  Total Bilirubin 0.3 - 1.2 mg/dL 0.7  Alkaline Phos 38 - 126 U/L 129(H)  AST 15 - 41 U/L 33  ALT 14 - 54 U/L 23    Discharge instruction: per After Visit Summary and "Baby and Me  Booklet".  After visit meds:  Ibuprofen Prenatal vitamin tablets  Diet: routine diet  Activity: Advance as tolerated. Pelvic rest for 6 weeks.   Outpatient follow up:6 weeks Follow up Appt:No future appointments. Follow up Visit:No follow-ups on file.  Postpartum contraception: Natural Family Planning  Newborn Data: Live born female  Birth Weight: 6 lb 11.2 oz (3039 g) APGAR: 8, 9  Newborn Delivery   Birth date/time:  11/10/2017 19:03:00 Delivery type:  Vaginal, Spontaneous     Baby Feeding: Breast Disposition:home with mother   11/12/2017 Konrad FelixKULWA,[place] Lapaglia WAKURU, MD  ADDENDUM: Blood pressures reviewed: Vitals:   11/12/17 0828 11/12/17 0830 11/12/17 1100 11/12/17 1230  BP: (!) 124/103 130/87 (!) 136/94 123/88  Pulse: 73  66   Resp: 18  18   Temp: 99.4 F (37.4 C)  99.3 F (37.4 C)   TempSrc: Oral  Oral   SpO2:      Weight:      Height:       Procardia had been offered for several diastolics greater than 100.  Patient declined Procardia XL even after discussion with lactation consultant and MD.  I discussed with patient dangers of elevated blood pressure and signs and symptoms of hypertension.  Will have visiting nurse check her BP at home in 1 week.  Dr. Sallye OberKulwa.

## 2017-11-12 NOTE — Progress Notes (Addendum)
OB Discharge Summary     Patient Name: Patricia Garrett DOB: Mar 10, 1976 MRN: [id number]  Date of admission: 11/10/2017 Delivering MD: Osborn Coho   Date of discharge: 11/12/2017  Admitting diagnosis: 40 WEEKS ROM Intrauterine pregnancy: [redacted]w[redacted]d     Secondary diagnosis:  Active Problems:   Indication for care in labor and delivery, antepartum  Additional problems: None     Discharge diagnosis: Term Pregnancy Delivered                                                                                                Post partum procedures:None  Augmentation: Pitocin  Complications: None  Hospital course:  Onset of Labor With Vaginal Delivery     42 y.o. yo [id number] at [redacted]w[redacted]d was admitted in Active Labor on 11/10/2017. Patient had an uncomplicated labor course as follows:  Membrane Rupture Time/Date: 1:00 AM ,11/10/2017   Intrapartum Procedures: Episiotomy: None [1]                                         Lacerations:  2nd degree [3]  Patient had a delivery of a Viable infant. 11/10/2017  Information for the patient's newborn:  Patricia, Garrett [[id number]]  Delivery Method: Vaginal, Spontaneous(Filed from Delivery Summary)    [name] had an uncomplicated postpartum course.  She is ambulating, tolerating a regular diet, passing flatus, and urinating well. Patient is discharged home in stable condition on 11/12/17.   Physical exam  Vitals:   11/11/17 1738 11/11/17 1807 11/11/17 2350 11/11/17 2351  BP: (!) 122/91 121/87 131/90 118/87  Pulse: 75 70 66 61  Resp: 16     Temp: 98.8 F (37.1 C)     TempSrc: Oral     SpO2:      Weight:      Height:       General: alert, cooperative and no distress Lochia: appropriate Uterine Fundus: firm Incision: N/A DVT Evaluation: No evidence of DVT seen on physical exam. Negative Homan's sign. Labs: Lab Results  Component Value Date   WBC 11.1 (H) 11/11/2017   HGB 11.8 (L) 11/11/2017   HCT 35.3 (L) 11/11/2017   MCV 83.3  11/11/2017   PLT 186 11/11/2017   CMP Latest Ref Rng & Units 11/11/2017  Glucose 65 - 99 mg/dL 75  BUN 6 - 20 mg/dL 10  Creatinine 6.96 - 2.95 mg/dL 2.84  Sodium 132 - 440 mmol/L 135  Potassium 3.5 - 5.1 mmol/L 4.3  Chloride 101 - 111 mmol/L 105  CO2 22 - 32 mmol/L 21(L)  Calcium 8.9 - 10.3 mg/dL 8.9  Total Protein 6.5 - 8.1 g/dL 6.5  Total Bilirubin 0.3 - 1.2 mg/dL 0.7  Alkaline Phos 38 - 126 U/L 129(H)  AST 15 - 41 U/L 33  ALT 14 - 54 U/L 23    Discharge instruction: per After Visit Summary and "Baby and Me Booklet".  After visit meds:  Allergies as of 11/12/2017   No Known Allergies     Medication  List    TAKE these medications   multivitamin with minerals Tabs tablet Take 1 tablet by mouth daily.       Diet: routine diet  Activity: Advance as tolerated. Pelvic rest for 6 weeks.   Outpatient follow up:6 weeks Follow up Appt:No future appointments. Follow up Visit:No follow-ups on file.  Postpartum contraception: Natural Family Planning  Newborn Data: Live born female  Birth Weight: 6 lb 11.2 oz (3039 g) APGAR: 8, 9  Newborn Delivery   Birth date/time:  11/10/2017 19:03:00 Delivery type:  Vaginal, Spontaneous     Baby Feeding: Breast Disposition:home with mother   11/12/2017 Kenney HousemanNancy Jean Garrett, CNM  ADDENDUM: Blood pressures reviewed: Vitals:   11/12/17 0828 11/12/17 0830 11/12/17 1100 11/12/17 1230  BP: (!) 124/103 130/87 (!) 136/94 123/88  Pulse: 73  66   Resp: 18  18   Temp: 99.4 F (37.4 C)  99.3 F (37.4 C)   TempSrc: Oral  Oral   SpO2:      Weight:      Height:       Procardia had been offered for several diastolics greater than 100.  Patient declined Procardia XL even after discussion with lactation consultant and MD.  I discussed with patient dangers of elevated blood pressure and signs and symptoms of hypertension.  Will have visiting nurse check her BP at home in 1 week.  Dr. Sallye OberKulwa.

## 2017-11-12 NOTE — Lactation Note (Signed)
This note was copied from a baby's chart. Lactation Consultation Note  Patient Name: Patricia Garrett ZOXWR'UToday's Date: 11/12/2017 Reason for consult: Follow-up assessment;Term  LC Visit Prior to Discharge: RN requested LC provide mother with information on the drug Procardia and the compatibility level with breastfeeding  This is an experienced breastfeeding mother whose infant is 40 weeks and 41 hours of age.  Mother has been breastfeeding well and has no questions/concerns at this time.  Latch score is a 10 and voids/stools are adequate.  Engorgement prevention/treatment reviewed.Mom made aware of O/P services, breastfeeding support groups, community resources, and our phone # for post-discharge questions.   LC printed a copy of the page from [facility]'s Medications and Mother's Milk related to the use of procardia.  This drug is considered an L2.  Mother appreciative of this information and will read the handout.  FOB present and supportive.  Mother will call as needed.  Maternal Data Formula Feeding for Exclusion: No Has patient been taught Hand Expression?: Yes Does the patient have breastfeeding experience prior to this delivery?: Yes  Feeding Feeding Type: Breast Fed Length of feed: 10 min(still feeding)  LATCH Score Latch: Grasps breast easily, tongue down, lips flanged, rhythmical sucking.  Audible Swallowing: Spontaneous and intermittent  Type of Nipple: Everted at rest and after stimulation  Comfort (Breast/Nipple): Soft / non-tender  Hold (Positioning): No assistance needed to correctly position infant at breast.  LATCH Score: 10  Interventions Interventions: Breast feeding basics reviewed  Lactation Tools Discussed/Used     Consult Status Consult Status: Complete Date: 11/12/17 Follow-up type: Call as needed    Irene PapBeth R Lakasha Mcfall 11/12/2017, 12:27 PM

## 2017-11-12 NOTE — Lactation Note (Signed)
This note was copied from a baby's chart. Lactation Consultation Note  Patient Name: Patricia Garrett Today's Date: 11/12/2017   [facility] to discharge:  Sign on door states, "Do Not Disturb" LC will attempt to visit again later.  Maternal Data    Feeding    LATCH Score                   Interventions    Lactation Tools Discussed/Used     Consult Status      Patricia Garrett Patricia Garrett 11/12/2017, 9:56 AM

## 2017-11-17 DIAGNOSIS — I10 Essential (primary) hypertension: Secondary | ICD-10-CM | POA: Diagnosis not present

## 2019-04-05 IMAGING — US US MFM OB LIMITED
1 series · 15 of 26 positions shown · non-contrast
Comparison: none

[Series 1: us mfm ob limited · 15 of 26 slices shown]
[im 1/26]
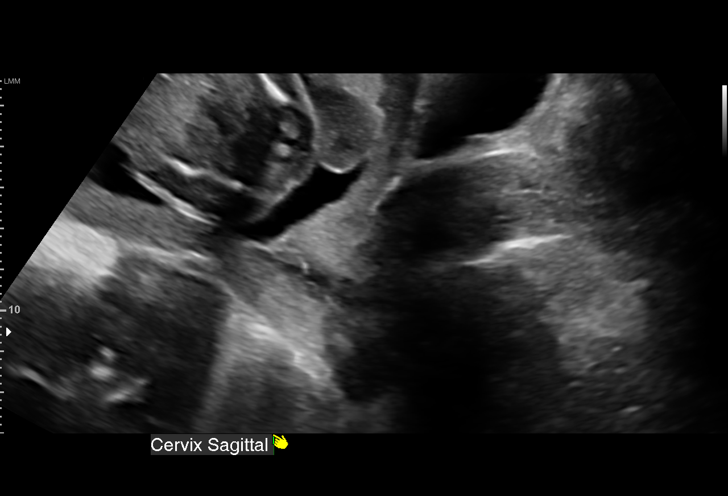
[im 3/26]
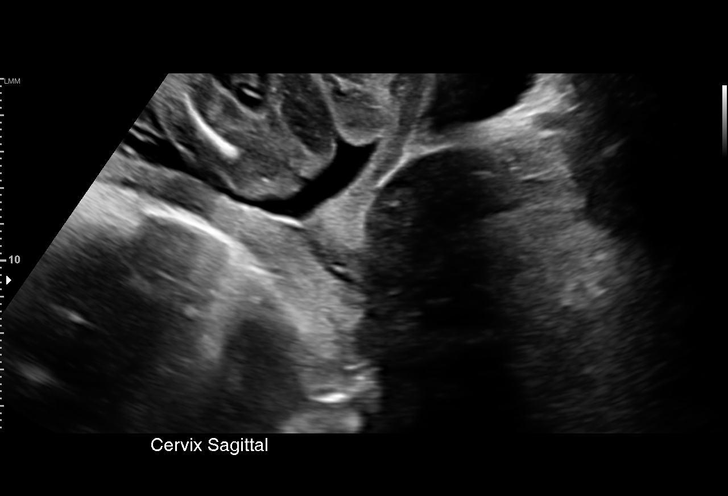
[im 5/26]
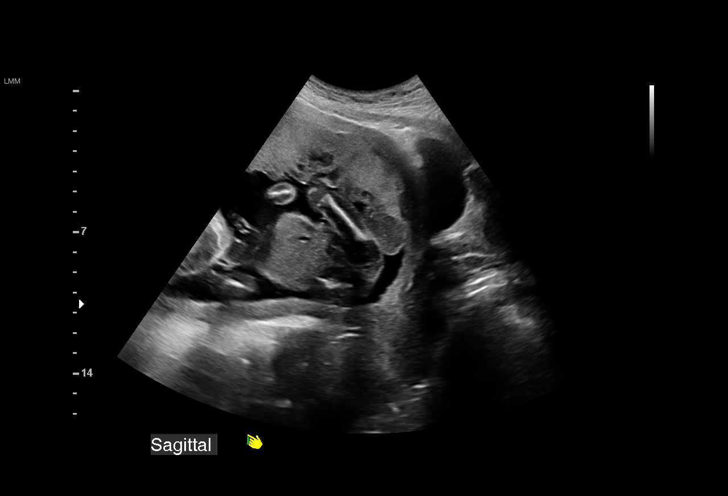
[im 7/26]
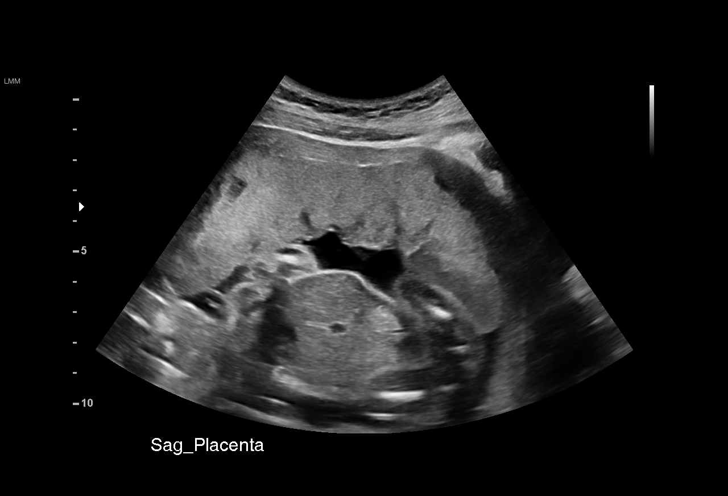
[im 8/26]
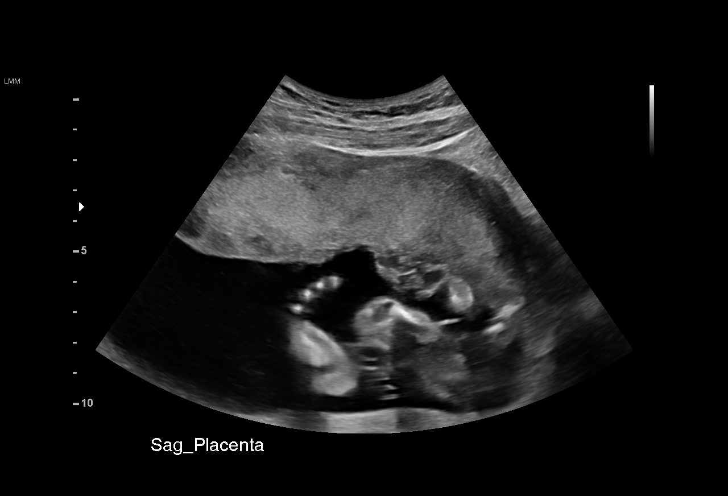
[im 10/26]
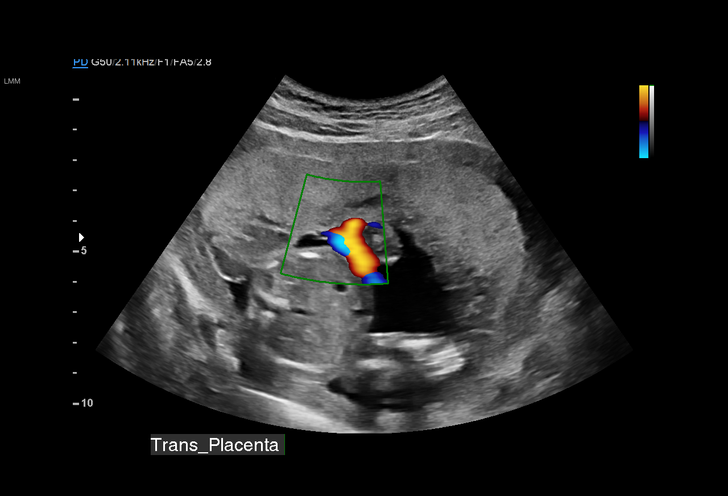
[im 12/26]
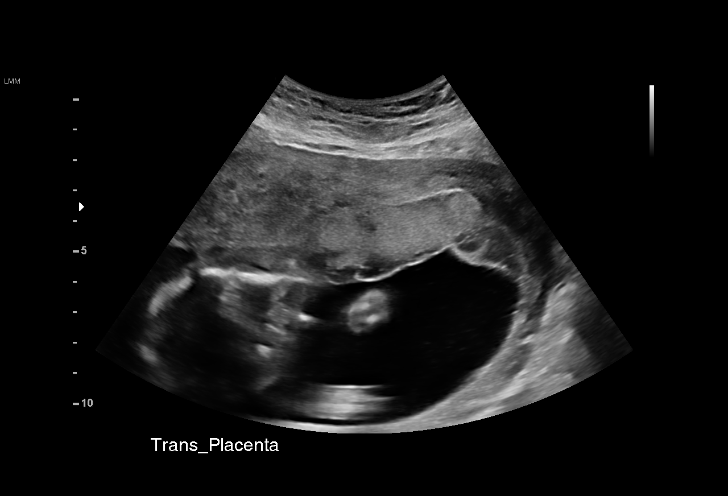
[im 14/26]
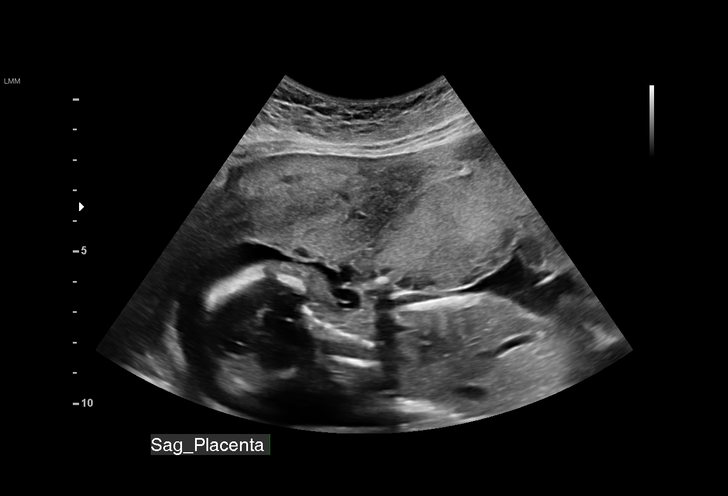
[im 15/26]
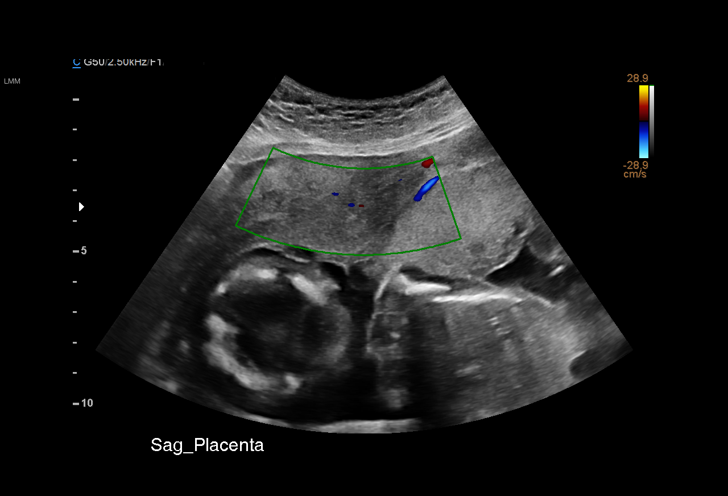
[im 17/26]
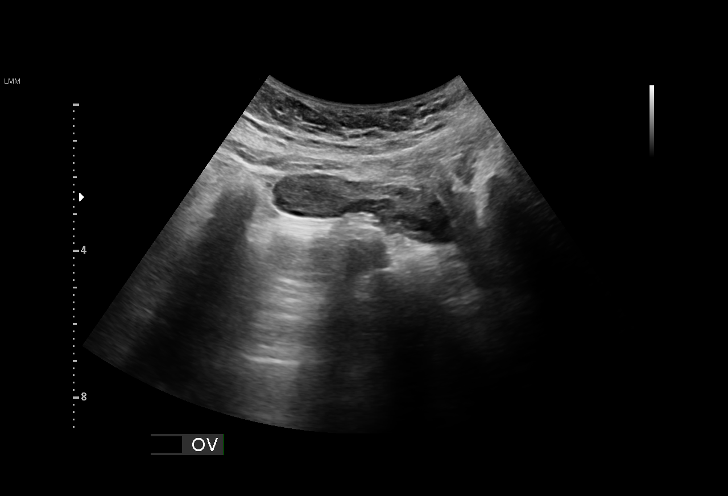
[im 19/26]
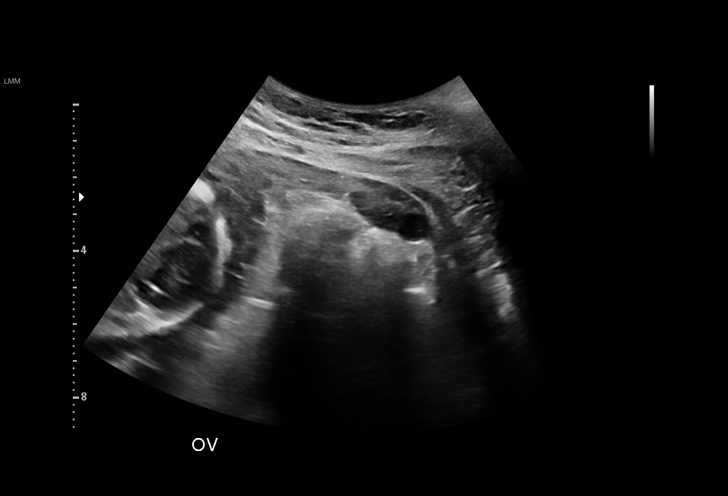
[im 20/26]
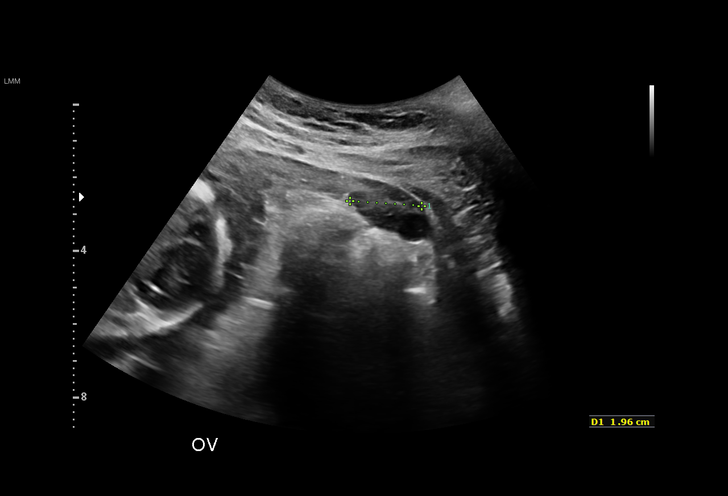
[im 22/26]
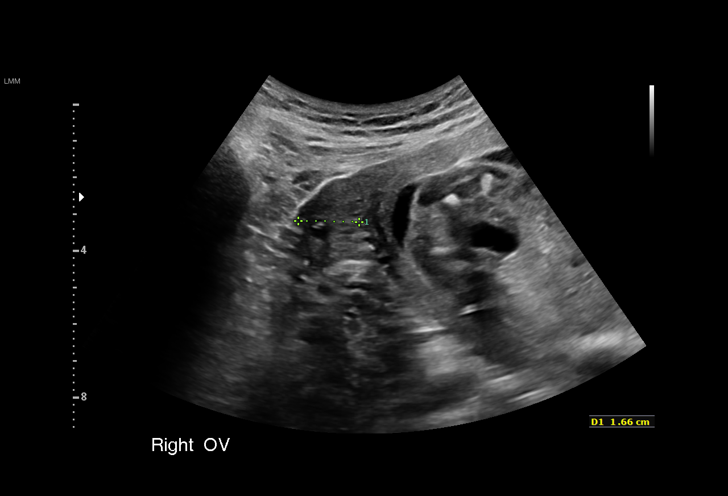
[im 24/26]
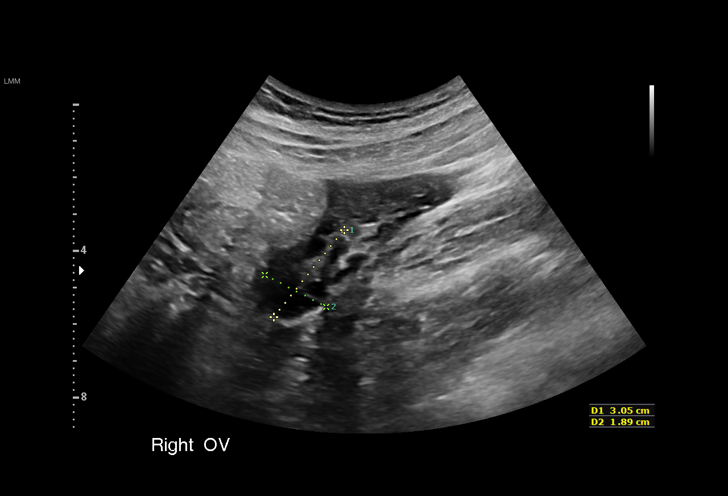
[im 26/26]
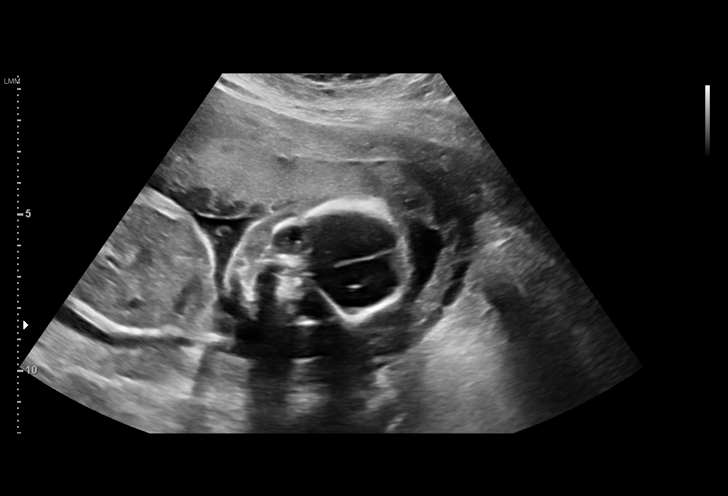

[15 of 26 positions shown; findings below may reference images not displayed]

MAU/Triage
TOFE NP
Obstetrics &
Gynecology
[address]
[place]

1  SHADESH BARNEY           [id number]       [id number]     [id number]
Indications

19 weeks gestation of pregnancy
Vaginal bleeding in pregnancy, second
trimester
OB History

Gravidity:    3         Term:   1        Prem:   1
Living:       2
Fetal Evaluation

Num Of Fetuses:     1
Fetal Heart         150
Rate(bpm):
Cardiac Activity:   Observed
Presentation:       Variable
Placenta:           Anterior, above cervical os
P. Cord Insertion:  Visualized, central

Amniotic Fluid
AFI FV:      Subjectively within normal limits

Largest Pocket(cm)
6.79
Comment:    No placental abnormality noted.
Gestational Age

Clinical EDD:  19w 3d                                        EDD:   11/10/17
Best:          19w 3d     Det. By:  Clinical EDD             EDD:   11/10/17
Cervix Uterus Adnexa

Cervix
Length:           3.58  cm.
Normal appearance by transabdominal scan.

Uterus
No abnormality visualized.

Left Ovary
Within normal limits.

Right Ovary
Within normal limits.

Cul De Sac:   No free fluid seen.

Adnexa:       No abnormality visualized.
Impression

IUP at 19+3 weeks with vaginal bleeding
Normal fetal cardiac activity and movement
Normal placentation without previa or markers for abruption
Normal amniotic fluid
Normal transabdominal views of cervix
Recommendations

Continue clinical evaluation and management
